# Patient Record
Sex: Male | Born: 1935 | Race: White | Hispanic: No | Marital: Married | Smoking: Former smoker
Health system: Southern US, Community
[De-identification: ages and names within clinical notes are randomized; demographics above are authoritative.]

## PROBLEM LIST (undated history)

## (undated) DIAGNOSIS — K219 Gastro-esophageal reflux disease without esophagitis: Secondary | ICD-10-CM

## (undated) DIAGNOSIS — C159 Malignant neoplasm of esophagus, unspecified: Secondary | ICD-10-CM

## (undated) DIAGNOSIS — I10 Essential (primary) hypertension: Secondary | ICD-10-CM

## (undated) DIAGNOSIS — I4891 Unspecified atrial fibrillation: Secondary | ICD-10-CM

## (undated) DIAGNOSIS — E785 Hyperlipidemia, unspecified: Secondary | ICD-10-CM

## (undated) DIAGNOSIS — I639 Cerebral infarction, unspecified: Secondary | ICD-10-CM

## (undated) DIAGNOSIS — E079 Disorder of thyroid, unspecified: Secondary | ICD-10-CM

## (undated) DIAGNOSIS — IMO0002 Reserved for concepts with insufficient information to code with codable children: Secondary | ICD-10-CM

## (undated) HISTORY — DX: Cerebral infarction, unspecified: I63.9

## (undated) HISTORY — DX: Gastro-esophageal reflux disease without esophagitis: K21.9

## (undated) HISTORY — DX: Reserved for concepts with insufficient information to code with codable children: IMO0002

## (undated) HISTORY — DX: Hyperlipidemia, unspecified: E78.5

## (undated) HISTORY — DX: Essential (primary) hypertension: I10

---

## 1983-02-25 HISTORY — PX: OTHER SURGICAL HISTORY: SHX169

## 1987-02-25 HISTORY — PX: BACK SURGERY: SHX140

## 1987-02-25 HISTORY — PX: CHOLECYSTECTOMY: SHX55

## 1999-09-09 ENCOUNTER — Encounter: Payer: Self-pay | Admitting: Internal Medicine

## 1999-09-09 ENCOUNTER — Ambulatory Visit (HOSPITAL_COMMUNITY): Admission: RE | Admit: 1999-09-09 | Discharge: 1999-09-09 | Payer: Self-pay | Admitting: Internal Medicine

## 2000-09-07 ENCOUNTER — Ambulatory Visit (HOSPITAL_COMMUNITY): Admission: RE | Admit: 2000-09-07 | Discharge: 2000-09-07 | Payer: Self-pay | Admitting: *Deleted

## 2001-01-07 ENCOUNTER — Encounter: Payer: Self-pay | Admitting: Emergency Medicine

## 2001-01-07 ENCOUNTER — Inpatient Hospital Stay (HOSPITAL_COMMUNITY): Admission: EM | Admit: 2001-01-07 | Discharge: 2001-01-10 | Payer: Self-pay | Admitting: Emergency Medicine

## 2001-01-07 ENCOUNTER — Encounter: Payer: Self-pay | Admitting: Internal Medicine

## 2001-01-08 ENCOUNTER — Encounter: Payer: Self-pay | Admitting: Internal Medicine

## 2006-04-20 ENCOUNTER — Ambulatory Visit (HOSPITAL_COMMUNITY): Admission: RE | Admit: 2006-04-20 | Discharge: 2006-04-20 | Payer: Self-pay | Admitting: *Deleted

## 2011-05-06 ENCOUNTER — Ambulatory Visit (INDEPENDENT_AMBULATORY_CARE_PROVIDER_SITE_OTHER): Payer: BLUE CROSS/BLUE SHIELD | Admitting: General Surgery

## 2011-05-06 ENCOUNTER — Encounter (INDEPENDENT_AMBULATORY_CARE_PROVIDER_SITE_OTHER): Payer: Self-pay | Admitting: General Surgery

## 2011-05-06 VITALS — BP 122/74 | HR 70 | Temp 97.8°F | Resp 18 | Ht 72.0 in | Wt 207.4 lb

## 2011-05-06 DIAGNOSIS — L723 Sebaceous cyst: Secondary | ICD-10-CM | POA: Insufficient documentation

## 2011-05-06 MED ORDER — HYDROCODONE-ACETAMINOPHEN 10-325 MG PO TABS
1.0000 | ORAL_TABLET | Freq: Four times a day (QID) | ORAL | Status: AC | PRN
Start: 2011-05-06 — End: 2012-05-05

## 2011-05-06 NOTE — Progress Notes (Signed)
Addended by: Cherylynn Ridges III on: 05/06/2011 09:48 AM   Modules accepted: Orders

## 2011-05-06 NOTE — Progress Notes (Signed)
Patient ID: Luis Mclean, male   DOB: [birth year], 78 y.o.   MRN: [id number]  Chief Complaint  Patient presents with  . Cyst    Neck    HPI Luis Mclean is a 78 y.o. male.  Sebaceous cyst on the back of his neck HPI  Has been there for several months, has not been infected.  No past medical history on file.  No past surgical history on file.  No family history on file.  Social History History  Substance Use Topics  . Smoking status: Former Smoker    Types: Cigars    Quit date: 05/05/1997  . Smokeless tobacco: Never Used  . Alcohol Use: No    No Known Allergies  No current outpatient prescriptions on file.    Review of Systems Review of Systems  Constitutional: Negative.   HENT: Positive for neck pain (posteriorly at sight of 4 cm cyst, not infected.).   Eyes: Negative.   Respiratory: Negative.   Cardiovascular: Negative.   Neurological: Negative.   Hematological: Negative.   Psychiatric/Behavioral: Negative.     Blood pressure 122/74, pulse 70, temperature 97.8 F (36.6 C), temperature source Temporal, resp. rate 18, height 6' (1.829 m), weight 207 lb 6.4 oz (94.076 kg).  Physical Exam Physical Exam  Constitutional: He is oriented to person, place, and time. He appears well-developed and well-nourished.  HENT:  Head: Normocephalic and atraumatic.  Eyes: Conjunctivae are normal. Pupils are equal, round, and reactive to light.  Neck: Normal range of motion. Neck supple. Tracheal tenderness (at mass on neck) present.    Cardiovascular: Normal rate and normal pulses.  An irregularly irregular rhythm present.  Pulmonary/Chest: Effort normal and breath sounds normal.  Abdominal: Soft. Bowel sounds are normal.  Neurological: He is alert and oriented to person, place, and time. He has normal reflexes.  Psychiatric: He has a normal mood and affect. His behavior is normal. Judgment and thought content normal.    Data Reviewed None  I have reviewed  the patients history and he does have medical problems of atrial fibrillation and hypertension.  Has had CVA in the past.  Assessment    Sebaceous cyst on the back of neck    Plan    Hold coumadin at the proper time Plan for surgery in the near future.       Noelle Hoogland III,Dylyn Mclaren O 05/06/2011, 9:11 AM

## 2011-06-09 ENCOUNTER — Other Ambulatory Visit (HOSPITAL_COMMUNITY): Payer: Self-pay

## 2011-06-16 ENCOUNTER — Ambulatory Visit: Admit: 2011-06-16 | Payer: Self-pay | Admitting: General Surgery

## 2011-06-16 SURGERY — IRRIGATION AND DEBRIDEMENT SEBACEOUS CYST
Anesthesia: General

## 2012-10-11 ENCOUNTER — Other Ambulatory Visit: Payer: Self-pay | Admitting: Gastroenterology

## 2012-10-11 DIAGNOSIS — R109 Unspecified abdominal pain: Secondary | ICD-10-CM

## 2012-10-20 ENCOUNTER — Ambulatory Visit
Admission: RE | Admit: 2012-10-20 | Discharge: 2012-10-20 | Disposition: A | Discharge status: Home / Self Care | Payer: Medicare Other | Source: Ambulatory Visit | Attending: Gastroenterology | Admitting: Gastroenterology

## 2012-10-20 DIAGNOSIS — R109 Unspecified abdominal pain: Secondary | ICD-10-CM

## 2012-10-21 ENCOUNTER — Other Ambulatory Visit: Payer: Self-pay | Admitting: Gastroenterology

## 2012-10-21 DIAGNOSIS — R9389 Abnormal findings on diagnostic imaging of other specified body structures: Secondary | ICD-10-CM

## 2012-10-29 ENCOUNTER — Ambulatory Visit
Admission: RE | Admit: 2012-10-29 | Discharge: 2012-10-29 | Disposition: A | Discharge status: Home / Self Care | Payer: Medicare Other | Source: Ambulatory Visit | Attending: Gastroenterology | Admitting: Gastroenterology

## 2012-10-29 DIAGNOSIS — R9389 Abnormal findings on diagnostic imaging of other specified body structures: Secondary | ICD-10-CM

## 2012-10-29 MED ORDER — GADOBENATE DIMEGLUMINE 529 MG/ML IV SOLN
9.0000 mL | Freq: Once | INTRAVENOUS | Status: AC | PRN
  Administered 2012-10-29: 9 mL via INTRAVENOUS

## 2012-11-08 ENCOUNTER — Other Ambulatory Visit: Payer: Self-pay | Admitting: Gastroenterology

## 2012-11-08 DIAGNOSIS — C159 Malignant neoplasm of esophagus, unspecified: Secondary | ICD-10-CM

## 2012-11-08 HISTORY — PX: OTHER SURGICAL HISTORY: SHX169

## 2012-11-08 HISTORY — DX: Malignant neoplasm of esophagus, unspecified: C15.9

## 2012-11-10 ENCOUNTER — Telehealth: Payer: Self-pay | Admitting: Oncology

## 2012-11-10 NOTE — Telephone Encounter (Signed)
REFERRAL FORWARDED TO GINA. D

## 2012-11-18 ENCOUNTER — Telehealth: Payer: Self-pay | Admitting: *Deleted

## 2012-11-18 NOTE — Telephone Encounter (Signed)
Unable to reach patient by phone.  Left voice message to return call.

## 2012-11-22 ENCOUNTER — Telehealth: Payer: Self-pay | Admitting: *Deleted

## 2012-11-22 NOTE — Telephone Encounter (Signed)
Received phone message from patient confirming appointment with Dr. Truett Perna on 11/23/12.

## 2012-11-23 ENCOUNTER — Other Ambulatory Visit: Payer: Self-pay | Admitting: *Deleted

## 2012-11-23 ENCOUNTER — Ambulatory Visit (HOSPITAL_BASED_OUTPATIENT_CLINIC_OR_DEPARTMENT_OTHER): Payer: Medicare Other

## 2012-11-23 ENCOUNTER — Encounter: Payer: Self-pay | Admitting: Oncology

## 2012-11-23 ENCOUNTER — Ambulatory Visit (HOSPITAL_BASED_OUTPATIENT_CLINIC_OR_DEPARTMENT_OTHER): Payer: Medicare Other | Admitting: Oncology

## 2012-11-23 ENCOUNTER — Telehealth: Payer: Self-pay | Admitting: Oncology

## 2012-11-23 DIAGNOSIS — K7689 Other specified diseases of liver: Secondary | ICD-10-CM

## 2012-11-23 DIAGNOSIS — E119 Type 2 diabetes mellitus without complications: Secondary | ICD-10-CM

## 2012-11-23 DIAGNOSIS — C159 Malignant neoplasm of esophagus, unspecified: Secondary | ICD-10-CM | POA: Insufficient documentation

## 2012-11-23 DIAGNOSIS — R1310 Dysphagia, unspecified: Secondary | ICD-10-CM

## 2012-11-23 DIAGNOSIS — C155 Malignant neoplasm of lower third of esophagus: Secondary | ICD-10-CM

## 2012-11-23 DIAGNOSIS — R52 Pain, unspecified: Secondary | ICD-10-CM

## 2012-11-23 DIAGNOSIS — N289 Disorder of kidney and ureter, unspecified: Secondary | ICD-10-CM

## 2012-11-23 NOTE — Telephone Encounter (Signed)
gv adn printed appt sched and avs for pt for OCT...emaield MW to add tx.

## 2012-11-23 NOTE — Progress Notes (Signed)
Checked in new patient with no financial issues. Mail and phone only °

## 2012-11-23 NOTE — Progress Notes (Signed)
[facility] New Patient Consult   Referring MD: Kendall Justo Gravley 79 y.o.  [birth year]    Reason for Referral: Esophagus cancer     HPI: He reports a three-month history of solid dysphagia, subxiphoid pain, and "indigestion ". He saw Dr. Evette Cristal and was referred for imaging studies. An upper GI study on 10/20/2012 revealed abnormal filling defects and a hiatal hernia and adjacent distal esophagus. A 13 mm barium tablet impacted at the GE junction. An abdominal ultrasound on 10/20/2012 revealed an area of questionable increased echogenicity at the posterior right liver concerning for a mass. The remainder of the liver appeared normal.  An MRI of the liver on 10/30/2012 confirmed a mass in segment 7 of the right hepatic lobe measuring 3.3 x 3 cm. The mass enhanced. The mass was felt to be indeterminate and a biopsy was recommended.  He was taken to an upper endoscopy procedure on 11/08/2012. A large mass was found in the lower third of the esophagus beginning at 36 cm and extending to 40 cm. The mass was partially obstructing. Biopsies were obtained. The distal portion of the mass could be seen at the GE junction on retroflexion. The stomach and duodenum appeared normal.  The pathology ([id number]) confirmed a squamous carcinoma with focal glandular differentiation.   Past Medical History  Diagnosis Date  . Diabetes mellitus   . Hyperlipidemia   . Hypertension   . Stroke-right hand tremor persists   15 years ago   . GERD (gastroesophageal reflux disease)   . Cyst     back of neck   .    Atrial fibrillation  .   Right foot drop following back surgery  .   Renal insufficiency  .   Right foot neuropathy  Past Surgical History  Procedure Laterality Date  . Cholecystectomy  1989  . Rectal fissure repair  1985  . Back surgery  1989   .     Family History  Problem Relation Age of Onset  . Cancer Brother     colon  . Cancer Brother     bone  .  Cancer Brother     kidney   5 siblings, maternal and paternal cousins had "cancer "-he cannot be specific  Current outpatient prescriptions:BENAZEPRIL HCL PO, Take 25 mg by mouth daily. Takes 1/2 daily, Disp: , Rfl: ;  gabapentin (NEURONTIN) 300 MG capsule, Take 300 mg by mouth every evening., Disp: , Rfl: ;  HYDROCHLOROTHIAZIDE PO, Take 40 mg by mouth daily. Patient takes 1/2 tablet daily per dosage., Disp: , Rfl: ;  insulin glargine (LANTUS) 100 UNIT/ML injection, Inject 50 Units into the skin every morning., Disp: , Rfl:  levothyroxine (SYNTHROID, LEVOTHROID) 100 MCG tablet, Take 100 mcg by mouth daily., Disp: , Rfl: ;  omeprazole (PRILOSEC) 20 MG capsule, Take 20 mg by mouth daily., Disp: , Rfl: ;  simvastatin (ZOCOR) 40 MG tablet, Take 40 mg by mouth every evening., Disp: , Rfl: ;  warfarin (COUMADIN) 7.5 MG tablet, Take 7.5 mg by mouth daily., Disp: , Rfl:   Allergies: No Known Allergies  Social History: He lives in [place]. He is retired from working as a Counselling psychologist. He smoked a pipe and quit 16 years ago. 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 No transfusion history. No risk factor for HIV or hepatitis. He was in the [employer]    ROS:   Positives include: Anorexia, 20 pound  weight loss, pain at the xiphoid, right footdrop following back surgery, right hand tremor, pains/tingling in the right toes  A complete ROS was otherwise negative.  Physical Exam:  There were no vitals taken for this visit.  HEENT: Oropharynx without visible mass, neck without mass Lungs: Clear bilaterally Cardiac: Regular rate and rhythm Abdomen: No hepatosplenomegaly, nontender, no GU: Testes without mass  Vascular: No leg edema Lymph nodes: No cervical, supraclavicular, axillary, or inguinal nodes Neurologic: Alert and oriented, the motor exam appears intact in the upper and lower extremities. Right greater than left hand tremor that appears worse with intention Skin: No  rash Musculoskeletal: No spine tenderness   LAB: CBC, chemistry panel, and CEA will be obtained on 11/25/2012  Radiology: As per history of present illness, PET scan ordered    Assessment/Plan:   1. 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 Indeterminate right hepatic mass on ultrasound-10/20/2012/MRI-10/29/2012  3. Dysphagia/subxiphoid pain secondary to #1  4. History of atrial fibrillation-maintained on Coumadin  5. Renal insufficiency  6. History of a CVA  7. 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 Diabetes   Disposition:   Mr. Monsanto has been diagnosed with esophagus cancer. I discussed the diagnosis and treatment options with Mr. Armstrong and his wife. There is no clinical evidence of distant metastatic disease, though there is an indeterminate right hepatic mass on an MRI 10/29/2012.  He is symptomatic with dysphagia and pain. I recommend concurrent chemotherapy and radiation. He will be referred for a staging PET scan. If there is no evidence of distant metastatic disease the plan will be to proceed with Taxol/carboplatin and radiation.  We reviewed the potential toxicities associated with the Taxol/carboplatin regimen including the chance for nausea/vomiting, mucositis, alopecia, diarrhea, and hematologic toxicity. We discussed the allergic reaction, neuropathy, bone pain, and ototoxicity associated with Taxol. We discussed the risk of an allergic reaction with carboplatin.  He will be premedicated with Decadron prior to the first cycle of chemotherapy. Mr. Tomerlin will be scheduled for baseline laboratory studies and a chemotherapy teaching class on 11/25/2012. He will be referred to Dr. Mitzi Hansen.  The plan is to begin concurrent therapy on 12/06/2012. I will see him for an office visit that day.  We discussed the indication for an esophagectomy if he is found to have localized disease. He has  multiple comorbid conditions and I doubt he will be a candidate for an esophagectomy. I will present his case at the GI tumor conference within the next few weeks.  Approximately 50 minutes were spent with the patient and his wife today. The majority of the time was used for counseling and coordination of care.  Ronny Korff 11/23/2012, 5:41 PM

## 2012-11-23 NOTE — Progress Notes (Signed)
Met with Luis Mclean and wife. Explained role of nurse navigator. Educational information provided on esophageal cancer  Referral made to dietician for diet education. [facility] resources provided to patient, including SW service information.  Contact names and phone numbers were provided for entire [facility] team.  No barriers to care identified.  Will continue to follow as needed.

## 2012-11-24 ENCOUNTER — Telehealth: Payer: Self-pay | Admitting: *Deleted

## 2012-11-24 ENCOUNTER — Encounter: Payer: Self-pay | Admitting: Radiation Oncology

## 2012-11-24 ENCOUNTER — Other Ambulatory Visit: Payer: Self-pay | Admitting: *Deleted

## 2012-11-24 DIAGNOSIS — C159 Malignant neoplasm of esophagus, unspecified: Secondary | ICD-10-CM

## 2012-11-24 MED ORDER — DEXAMETHASONE 2 MG PO TABS: 10.0000 mg | ORAL_TABLET | ORAL | Status: DC

## 2012-11-24 MED ORDER — PROCHLORPERAZINE MALEATE 5 MG PO TABS: 5.0000 mg | ORAL_TABLET | Freq: Four times a day (QID) | ORAL | Status: DC | PRN

## 2012-11-24 MED ORDER — LIDOCAINE-PRILOCAINE 2.5-2.5 % EX CREA: TOPICAL_CREAM | CUTANEOUS | Status: DC | PRN

## 2012-11-24 NOTE — Progress Notes (Signed)
Spoke with supervisor in nuclear med regarding getting PET scan done prior to his 10/13 chemo at request of Dr. Truett Perna. Will have precert done asap by managed care department so he can be worked in when a cancellation occurs, which is common. Fairly sure he will be able to be worked in prior to 10/13. Left message with Lilyan Punt in managed care to start working on precert.

## 2012-11-24 NOTE — Progress Notes (Signed)
GI Location of Tumor / Histology: Lower third esophagus   Patient presented 3 month history of solid dysphagia,subxiphoid pain and indigestion  Biopsies of : 11/08/12:Esophagus, biopsy, mass- CARCINOMA WITH SQUAMOUS AND FOCAL GLANDULAR DIFFERENTIATION.  Past/Anticipated interventions by surgeon, if any:   Past/Anticipated interventions by medical oncology, if any: referred for staging Pet scan to be done before 12/06/12;  Will determine whether to start Taxol/carboplatin , Chemotherapy education 11/25/12 at 4:30pm ,appt 12/06/12  Weight changes, if any: 20 lb wt.loss  Bowel/Bladder complaints, if any: none Nausea / Vomiting, if any: yes ,  Reflux ,got dizzy and threw up  11/24/12 dinner time   Pain issues, if any: xiphoid,  SAFETY ISSUES:foot drop right, unsteady,hx stroke ,dizzyness yesterday, hx vertigo,   Prior radiation? no  Pacemaker/ICD? no  Is the patient on methotrexate? no  Current Complaints / other details: Married,  3 sons, Retired Patent attorney was in [employer]; ,brother colon cancer, 2nd brother bone cancer,3rd brother kidney cancer, maternal and paternal cousins unkown types cancer /hx Etoh abuse quit 30 years ago, smoked pipe quit 16 years ago,right hepatic mass U/s 10/20/12 and MRI 10/29/12 right foot drop& Neuropathy , s/p back surgery, Hx A-fib/coumadin maintained, hx stroke,15 years ago,r hand tremors still

## 2012-11-24 NOTE — Telephone Encounter (Signed)
Per staff message and POF I have scheduled appts.  JMW  

## 2012-11-25 ENCOUNTER — Telehealth: Payer: Self-pay | Admitting: *Deleted

## 2012-11-25 ENCOUNTER — Ambulatory Visit
Admission: RE | Admit: 2012-11-25 | Discharge: 2012-11-25 | Disposition: A | Discharge status: Home / Self Care | Payer: Medicare Other | Source: Ambulatory Visit | Attending: Radiation Oncology | Admitting: Radiation Oncology

## 2012-11-25 ENCOUNTER — Encounter: Payer: Self-pay | Admitting: Radiation Oncology

## 2012-11-25 ENCOUNTER — Other Ambulatory Visit: Payer: Medicare Other

## 2012-11-25 VITALS — BP 161/90 | HR 101 | Temp 97.9°F | Resp 20 | Ht 72.0 in | Wt 191.0 lb

## 2012-11-25 DIAGNOSIS — E079 Disorder of thyroid, unspecified: Secondary | ICD-10-CM | POA: Insufficient documentation

## 2012-11-25 DIAGNOSIS — Z794 Long term (current) use of insulin: Secondary | ICD-10-CM | POA: Insufficient documentation

## 2012-11-25 DIAGNOSIS — I10 Essential (primary) hypertension: Secondary | ICD-10-CM | POA: Insufficient documentation

## 2012-11-25 DIAGNOSIS — Z8673 Personal history of transient ischemic attack (TIA), and cerebral infarction without residual deficits: Secondary | ICD-10-CM | POA: Insufficient documentation

## 2012-11-25 DIAGNOSIS — Z7901 Long term (current) use of anticoagulants: Secondary | ICD-10-CM | POA: Insufficient documentation

## 2012-11-25 DIAGNOSIS — E785 Hyperlipidemia, unspecified: Secondary | ICD-10-CM | POA: Insufficient documentation

## 2012-11-25 DIAGNOSIS — C159 Malignant neoplasm of esophagus, unspecified: Secondary | ICD-10-CM

## 2012-11-25 DIAGNOSIS — C155 Malignant neoplasm of lower third of esophagus: Secondary | ICD-10-CM | POA: Insufficient documentation

## 2012-11-25 DIAGNOSIS — K769 Liver disease, unspecified: Secondary | ICD-10-CM | POA: Insufficient documentation

## 2012-11-25 DIAGNOSIS — Z79899 Other long term (current) drug therapy: Secondary | ICD-10-CM | POA: Insufficient documentation

## 2012-11-25 DIAGNOSIS — K219 Gastro-esophageal reflux disease without esophagitis: Secondary | ICD-10-CM | POA: Insufficient documentation

## 2012-11-25 DIAGNOSIS — E119 Type 2 diabetes mellitus without complications: Secondary | ICD-10-CM | POA: Insufficient documentation

## 2012-11-25 HISTORY — DX: Unspecified atrial fibrillation: I48.91

## 2012-11-25 HISTORY — DX: Disorder of thyroid, unspecified: E07.9

## 2012-11-25 HISTORY — DX: Malignant neoplasm of esophagus, unspecified: C15.9

## 2012-11-25 NOTE — Telephone Encounter (Signed)
Called Aletta Casacchia,RN and Hinton Rao left voice message, patient cancelling todays chemotherapy education, not feeling well, and wants to reschedule at later date, can leave voice message on home phone, here for consult today 8:56 AM

## 2012-11-25 NOTE — Telephone Encounter (Signed)
Received message from patient requesting to change chemo class to 10/3 or a day next week.  Will give message to Dr. Kalman Drape nurse.

## 2012-11-25 NOTE — Progress Notes (Signed)
Please see the Nurse Progress Note in the MD Initial Consult Encounter for this patient. 

## 2012-11-26 ENCOUNTER — Ambulatory Visit
Admission: RE | Admit: 2012-11-26 | Discharge: 2012-11-26 | Disposition: A | Discharge status: Home / Self Care | Payer: Medicare Other | Source: Ambulatory Visit | Attending: Radiation Oncology | Admitting: Radiation Oncology

## 2012-11-26 DIAGNOSIS — C155 Malignant neoplasm of lower third of esophagus: Secondary | ICD-10-CM

## 2012-11-26 DIAGNOSIS — Z510 Encounter for antineoplastic radiation therapy: Secondary | ICD-10-CM | POA: Insufficient documentation

## 2012-11-26 DIAGNOSIS — R5381 Other malaise: Secondary | ICD-10-CM | POA: Insufficient documentation

## 2012-11-26 DIAGNOSIS — E860 Dehydration: Secondary | ICD-10-CM | POA: Insufficient documentation

## 2012-11-26 DIAGNOSIS — Z79899 Other long term (current) drug therapy: Secondary | ICD-10-CM | POA: Insufficient documentation

## 2012-11-26 DIAGNOSIS — C159 Malignant neoplasm of esophagus, unspecified: Secondary | ICD-10-CM | POA: Insufficient documentation

## 2012-11-26 DIAGNOSIS — K209 Esophagitis, unspecified without bleeding: Secondary | ICD-10-CM | POA: Insufficient documentation

## 2012-11-26 DIAGNOSIS — C787 Secondary malignant neoplasm of liver and intrahepatic bile duct: Secondary | ICD-10-CM | POA: Insufficient documentation

## 2012-11-26 NOTE — Progress Notes (Signed)
Radiation Oncology         [phone] ________________________________  Name: Luis Mclean MRN: [id number]  Date: 11/25/2012  DOB: Aug 19, 1929  WU:[id number],JYNWGN DAVIDSON, MD  Ladene Artist, MD     REFERRING PHYSICIAN: Ladene Artist, MD   DIAGNOSIS: The encounter diagnosis was Esophagus cancer.   HISTORY OF PRESENT ILLNESS::Luis Mclean is a 79 y.o. male who is seen for an initial consultation visit. The patient has a history of dysphasia as well as some subxiphoid pain. He states that this has been present for several months. He also has an approximate 20 pound weight loss. The patient proceeded with an upper GI study on 10/20/2012. This was abnormal with a filling defect with barium impacted at the GE junction. He proceeded to undergo an abdominal ultrasound also on this date which showed a posterior right liver area concerning for a possible mass.  The patient proceeded to undergo an MRI scan of the abdomen on 10/29/2012. The right hepatic lobe mass had nonspecific imaging characteristics and malignancy could not be excluded.  The patient proceeded to undergo an upper endoscopy on 11/08/2012. A large mass was present within the lower third of the esophagus beginning at 36 cm and extending for 4 cm more distally. This was partially obstructing and biopsies were obtained. The mass was seen extending to the GE junction. Pathology returned positive for squamous cell carcinoma.   The patient has been seen by Dr. Truett Perna in medical oncology. A PET scan is pending. No biopsy currently has been scheduled for the liver mass pending the PET scan.   PREVIOUS RADIATION THERAPY: No   PAST MEDICAL HISTORY:  has a past medical history of Hyperlipidemia; Hypertension; Stroke; GERD (gastroesophageal reflux disease); Cyst; Esophageal cancer (11/08/12); Diabetes mellitus; A-fib; and Thyroid disease.     PAST SURGICAL HISTORY: Past Surgical History  Procedure Laterality Date  .  Cholecystectomy  1989  . Rectal fissure repair  1985  . Back surgery  1989  . Esophageal biopsy  11/08/12    squamous cell & focal glandular diff     FAMILY HISTORY: family history includes Cancer in his brother, brother, and brother.   SOCIAL HISTORY:  reports that he quit smoking about 15 years ago. His smoking use included Cigars. He has never used smokeless tobacco. He reports that he does not drink alcohol or use illicit drugs.   ALLERGIES: Review of patient's allergies indicates no known allergies.   MEDICATIONS:  Current Outpatient Prescriptions  Medication Sig Dispense Refill  . BENAZEPRIL HCL PO Take 25 mg by mouth daily. Takes 1/2 daily      . diphenhydrAMINE (SOMINEX) 25 MG tablet Take 25 mg by mouth at bedtime as needed for sleep.      Marland Kitchen gabapentin (NEURONTIN) 300 MG capsule Take 300 mg by mouth every evening.      Marland Kitchen HYDROCHLOROTHIAZIDE PO Take 40 mg by mouth daily. Patient takes 1/2 tablet daily per dosage.      . insulin glargine (LANTUS) 100 UNIT/ML injection Inject 50 Units into the skin every morning.      Marland Kitchen levothyroxine (SYNTHROID, LEVOTHROID) 100 MCG tablet Take 100 mcg by mouth daily.      Marland Kitchen omeprazole (PRILOSEC) 20 MG capsule Take 20 mg by mouth daily.      . simvastatin (ZOCOR) 40 MG tablet Take 40 mg by mouth every evening.      . warfarin (COUMADIN) 7.5 MG tablet Take 7.5 mg by mouth daily.      Marland Kitchen  dexamethasone (DECADRON) 2 MG tablet Take 5 tablets (10 mg total) by mouth as directed. Take #5 tabs (10 mg) at 10 pm night before 1st chemo AND at 6 am day of 1st chemo.  10 tablet  0  . lidocaine-prilocaine (EMLA) cream Apply topically as needed. Apply 1 teaspoon to PAC site 1-2 hours prior to stick and cover with plastic wrap  30 g  prn  . prochlorperazine (COMPAZINE) 5 MG tablet Take 1 tablet (5 mg total) by mouth every 6 (six) hours as needed for nausea.  60 tablet  1   No current facility-administered medications for this encounter.     REVIEW OF SYSTEMS:   A 15 point review of systems is documented in the electronic medical record. This was obtained by the nursing staff. However, I reviewed this with the patient to discuss relevant findings and make appropriate changes.  Pertinent items are noted in HPI.    PHYSICAL EXAM:  height is 6' (1.829 m) and weight is 191 lb (86.637 kg). His oral temperature is 97.9 F (36.6 C). His blood pressure is 161/90 and his pulse is 101. His respiration is 20.   General: Well-developed, in no acute distress HEENT: Normocephalic, atraumatic Cardiovascular: Regular rate and rhythm Respiratory: Clear to auscultation bilaterally GI: Soft, nontender, normal bowel sounds Extremities: No edema present    LABORATORY DATA:  No results found for this basename: WBC, HGB, HCT, MCV, PLT   No results found for this basename: NA, K, CL, CO2   No results found for this basename: ALT, AST, GGT, ALKPHOS, BILITOT      RADIOGRAPHY: Mr Abdomen W Wo Contrast  10/30/2012   CLINICAL DATA:  Abdominal pain and indigestion. Possible mass in the right hepatic lobe.  EXAM: MRI ABDOMEN WITH AND WITHOUT CONTRAST  TECHNIQUE: Multiplanar multisequence MR imaging of the abdomen was performed both before and after administration of intravenous contrast.  CONTRAST:  9mL MULTIHANCE GADOBENATE DIMEGLUMINE 529 MG/ML IV SOLN  COMPARISON:  10/20/2012  FINDINGS: Hiatal hernia noted with irregular wall thickening as shown on prior upper GI examination.  No biliary dilatation or dorsal pancreatic duct dilatation. Colonic diverticulosis is observed with chronic appearing bilateral para renal stranding. Adrenal glands normal  A mass in segment 7 of the right hepatic lobe measures 3.3 x 3.0 cm. This appears to have very subtle signal dropout on out of phase images and mildly increased T2 signal compared to hepatic parenchyma.  After contrast administration, the mass remains low attenuation with respect to the hepatic parenchyma but does appear to enhance,  increasing from 165 units 2 274 units by the 92nd images on a representative internal section. On delayed images the enhancement is somewhat heterogeneous.  Several right-sided Bosniak category 2 cysts are present, with the larger measuring 3.0 by 2.4 cm on image 60 of series 11. Mildly enlarged gastrohepatic ligament lymph node at 1.1 cm.  IMPRESSION: 1. The segment 7 right hepatic lobe mass has nonspecific imaging characteristics. Its presence was not noted on the prior CT scan from 09/09/1999. Malignancy is not excluded and tissue diagnosis is recommended. 2. Hiatal hernia with wall thickening as shown on prior upper GI examination. This appeared irregular and worrisome for the possibility of malignancy on upper GI exam, and if not recently performed, upper endoscopy remains recommended. 3. Colonic diverticulosis. 4. Complex but benign appearing right renal cysts. 5. Mild enlarged gastrohepatic ligament lymph node.   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 His staging is still pending with a PET scan having been ordered as soon as possible. Depending on this result, additional workup may be appropriate including a biopsy of a possible suspicious area within the liver. Endoscopic ultrasound has not been completed and this also may depend on the PET scan. This also may not significantly changed his management for multiple reasons including the fact that the patient does not appear to be a very good candidate for surgery given his age and comorbidities. In the absence of planned surgical resection I would recommend definitive chemoradiotherapy even for a low T. stage in the absence of nodal disease.  I therefore discussed with the patient a potential role for radiotherapy in this setting. We discussed a 5-1/2-6 week course of treatment with daily radiation. I discussed the  rationale of this treatment in addition to the possible side effects and risks of treatment. All of his questions were answered. The patient is interested in proceeding with further workup as noted above and is anxious to get started with treatment as soon as feasible.    PLAN: A tentative start date has been set at 12/06/2012 this will depend to some degree on when the patient's PET scan can be completed. The patient will either be treated with a IMRT technique from the beginning of his course of radiation, or he may begin with a boost treatment initially using conventional technique prior to IMRT.     I spent 60 minutes face to face with the patient and more than 50% of that time was spent in counseling and/or coordination of care.    ________________________________   Radene Gunning, MD, PhD

## 2012-11-29 ENCOUNTER — Telehealth: Payer: Self-pay | Admitting: *Deleted

## 2012-11-29 ENCOUNTER — Other Ambulatory Visit: Payer: Self-pay | Admitting: *Deleted

## 2012-11-29 NOTE — Telephone Encounter (Signed)
Spoke with patient's wife by phone and gave appointment for PET scan on 12/02/12.  Instructions, per Radiology, given.  POF sent to scheduling to re-schedule chemo class for this week and notify patient.  Patient's wife aware of above and was without questions.

## 2012-11-30 ENCOUNTER — Telehealth: Payer: Self-pay | Admitting: Oncology

## 2012-11-30 NOTE — Telephone Encounter (Signed)
Talked to ptr's wife she is aware of chemo class date and time

## 2012-12-01 ENCOUNTER — Telehealth: Payer: Self-pay | Admitting: Oncology

## 2012-12-01 ENCOUNTER — Other Ambulatory Visit: Payer: Self-pay | Admitting: *Deleted

## 2012-12-01 NOTE — Telephone Encounter (Signed)
Gave pt appt for lab and chemo fotr 10/20, pt will get new appt calendar tomorrow

## 2012-12-02 ENCOUNTER — Encounter: Payer: Self-pay | Admitting: *Deleted

## 2012-12-02 ENCOUNTER — Encounter (HOSPITAL_COMMUNITY)
Admission: RE | Admit: 2012-12-02 | Discharge: 2012-12-02 | Disposition: A | Discharge status: Home / Self Care | Payer: Medicare Other | Source: Ambulatory Visit | Attending: Oncology | Admitting: Oncology

## 2012-12-02 ENCOUNTER — Encounter (HOSPITAL_COMMUNITY): Payer: Self-pay

## 2012-12-02 ENCOUNTER — Other Ambulatory Visit: Payer: Medicare Other

## 2012-12-02 DIAGNOSIS — C159 Malignant neoplasm of esophagus, unspecified: Secondary | ICD-10-CM

## 2012-12-02 LAB — GLUCOSE, CAPILLARY: Glucose-Capillary: 214 mg/dL — ABNORMAL HIGH (ref 70–99)

## 2012-12-02 MED ORDER — FLUDEOXYGLUCOSE F - 18 (FDG) INJECTION
20.3000 | Freq: Once | INTRAVENOUS | Status: AC | PRN
  Administered 2012-12-02: 20.3 via INTRAVENOUS

## 2012-12-03 ENCOUNTER — Telehealth: Payer: Self-pay | Admitting: *Deleted

## 2012-12-03 NOTE — Telephone Encounter (Signed)
after speaking with Archer Asa,, she wasn't sure patient has been told any of this, and that Dr.Sherrill is off also today, they will proceed with seeing patient on Monday", called and informed Dr.moody, he said Okay and that he would just proceed then on Monday and port and treat patient as scheduled,can talk with Dr.Sherrill when he gets back" 11:45 AM

## 2012-12-03 NOTE — Telephone Encounter (Signed)
Call from pharmacy to make Dr. Truett Perna aware pt has not had creatinine checked for chemo calculation. Called pt, instructed him to arrive at 0945 for lab prior to office on 10/13. He voiced understanding. Noted orders in computer for CBC/CMET and CEA. Request to schedulers for Lab appt.

## 2012-12-06 ENCOUNTER — Other Ambulatory Visit: Payer: Self-pay | Admitting: *Deleted

## 2012-12-06 ENCOUNTER — Telehealth: Payer: Self-pay | Admitting: Oncology

## 2012-12-06 ENCOUNTER — Ambulatory Visit: Admission: RE | Admit: 2012-12-06 | Payer: Medicare Other | Source: Ambulatory Visit | Admitting: Radiation Oncology

## 2012-12-06 ENCOUNTER — Ambulatory Visit
Admission: RE | Admit: 2012-12-06 | Discharge: 2012-12-06 | Disposition: A | Discharge status: Home / Self Care | Payer: Medicare Other | Source: Ambulatory Visit | Attending: Radiation Oncology | Admitting: Radiation Oncology

## 2012-12-06 ENCOUNTER — Ambulatory Visit: Payer: Medicare Other | Admitting: Nutrition

## 2012-12-06 ENCOUNTER — Ambulatory Visit (HOSPITAL_BASED_OUTPATIENT_CLINIC_OR_DEPARTMENT_OTHER): Payer: Medicare Other

## 2012-12-06 ENCOUNTER — Ambulatory Visit: Payer: Medicare Other | Admitting: Lab

## 2012-12-06 ENCOUNTER — Ambulatory Visit (HOSPITAL_BASED_OUTPATIENT_CLINIC_OR_DEPARTMENT_OTHER): Payer: Medicare Other | Admitting: Oncology

## 2012-12-06 ENCOUNTER — Telehealth: Payer: Self-pay | Admitting: *Deleted

## 2012-12-06 ENCOUNTER — Other Ambulatory Visit (HOSPITAL_BASED_OUTPATIENT_CLINIC_OR_DEPARTMENT_OTHER): Payer: Medicare Other | Admitting: Lab

## 2012-12-06 VITALS — BP 154/73 | HR 111 | Temp 97.7°F | Resp 20 | Ht 72.0 in | Wt 191.0 lb

## 2012-12-06 VITALS — BP 135/86 | HR 70 | Temp 96.6°F | Resp 18

## 2012-12-06 DIAGNOSIS — C155 Malignant neoplasm of lower third of esophagus: Secondary | ICD-10-CM

## 2012-12-06 DIAGNOSIS — K7689 Other specified diseases of liver: Secondary | ICD-10-CM

## 2012-12-06 DIAGNOSIS — E119 Type 2 diabetes mellitus without complications: Secondary | ICD-10-CM

## 2012-12-06 DIAGNOSIS — Z5111 Encounter for antineoplastic chemotherapy: Secondary | ICD-10-CM

## 2012-12-06 DIAGNOSIS — C159 Malignant neoplasm of esophagus, unspecified: Secondary | ICD-10-CM

## 2012-12-06 DIAGNOSIS — R079 Chest pain, unspecified: Secondary | ICD-10-CM

## 2012-12-06 DIAGNOSIS — Z23 Encounter for immunization: Secondary | ICD-10-CM

## 2012-12-06 DIAGNOSIS — N289 Disorder of kidney and ureter, unspecified: Secondary | ICD-10-CM

## 2012-12-06 DIAGNOSIS — R1310 Dysphagia, unspecified: Secondary | ICD-10-CM

## 2012-12-06 LAB — COMPREHENSIVE METABOLIC PANEL (CC13)
ALT: 7 U/L (ref 0–55)
AST: 11 U/L (ref 5–34)
Alkaline Phosphatase: 89 U/L (ref 40–150)
Potassium: 5 mEq/L (ref 3.5–5.1)
Sodium: 132 mEq/L — ABNORMAL LOW (ref 136–145)
Total Bilirubin: 0.3 mg/dL (ref 0.20–1.20)
Total Protein: 7.2 g/dL (ref 6.4–8.3)

## 2012-12-06 LAB — CBC WITH DIFFERENTIAL/PLATELET
EOS%: 0 % (ref 0.0–7.0)
MCH: 29 pg (ref 27.2–33.4)
MCV: 87.5 fL (ref 79.3–98.0)
MONO%: 0.7 % (ref 0.0–14.0)
NEUT#: 8.3 10*3/uL — ABNORMAL HIGH (ref 1.5–6.5)
RBC: 4.73 10*6/uL (ref 4.20–5.82)
RDW: 14.1 % (ref 11.0–14.6)
lymph#: 1 10*3/uL (ref 0.9–3.3)
nRBC: 0 % (ref 0–0)

## 2012-12-06 MED ORDER — DIPHENHYDRAMINE HCL 50 MG/ML IJ SOLN
25.0000 mg | Freq: Once | INTRAMUSCULAR | Status: AC
  Administered 2012-12-06: 25 mg via INTRAVENOUS

## 2012-12-06 MED ORDER — INSULIN REGULAR HUMAN 100 UNIT/ML IJ SOLN
10.0000 [IU] | Freq: Once | INTRAMUSCULAR | Status: AC
  Administered 2012-12-06: 15:00:00 via SUBCUTANEOUS
  Filled 2012-12-06: qty 0.1

## 2012-12-06 MED ORDER — SODIUM CHLORIDE 0.9 % IV SOLN
130.0000 mg | Freq: Once | INTRAVENOUS | Status: AC
  Administered 2012-12-06: 130 mg via INTRAVENOUS
  Filled 2012-12-06: qty 13

## 2012-12-06 MED ORDER — FAMOTIDINE IN NACL 20-0.9 MG/50ML-% IV SOLN
20.0000 mg | Freq: Once | INTRAVENOUS | Status: AC
  Administered 2012-12-06: 20 mg via INTRAVENOUS

## 2012-12-06 MED ORDER — FAMOTIDINE IN NACL 20-0.9 MG/50ML-% IV SOLN
INTRAVENOUS | Status: AC
  Filled 2012-12-06: qty 50

## 2012-12-06 MED ORDER — ONDANSETRON 16 MG/50ML IVPB (CHCC)
16.0000 mg | Freq: Once | INTRAVENOUS | Status: AC
  Administered 2012-12-06: 16 mg via INTRAVENOUS

## 2012-12-06 MED ORDER — DIPHENHYDRAMINE HCL 50 MG/ML IJ SOLN
INTRAMUSCULAR | Status: AC
  Filled 2012-12-06: qty 1

## 2012-12-06 MED ORDER — ONDANSETRON 16 MG/50ML IVPB (CHCC)
INTRAVENOUS | Status: AC
  Filled 2012-12-06: qty 16

## 2012-12-06 MED ORDER — SODIUM CHLORIDE 0.9 % IV SOLN
Freq: Once | INTRAVENOUS | Status: AC
  Administered 2012-12-06: 12:00:00 via INTRAVENOUS

## 2012-12-06 MED ORDER — INFLUENZA VAC SPLIT QUAD 0.5 ML IM SUSP
0.5000 mL | Freq: Once | INTRAMUSCULAR | Status: AC
  Administered 2012-12-06: 0.5 mL via INTRAMUSCULAR
  Filled 2012-12-06: qty 0.5

## 2012-12-06 MED ORDER — DEXAMETHASONE SODIUM PHOSPHATE 10 MG/ML IJ SOLN
10.0000 mg | Freq: Once | INTRAMUSCULAR | Status: AC
  Administered 2012-12-06: 10 mg via INTRAVENOUS

## 2012-12-06 MED ORDER — DEXAMETHASONE SODIUM PHOSPHATE 10 MG/ML IJ SOLN
INTRAMUSCULAR | Status: AC
  Filled 2012-12-06: qty 1

## 2012-12-06 MED ORDER — PACLITAXEL CHEMO INJECTION 300 MG/50ML
50.0000 mg/m2 | Freq: Once | INTRAVENOUS | Status: AC
  Administered 2012-12-06: 108 mg via INTRAVENOUS
  Filled 2012-12-06: qty 18

## 2012-12-06 NOTE — Patient Instructions (Signed)
[facility] Discharge Instructions for Patients Receiving Chemotherapy  Today you received the following chemotherapy agents Taxol/Carboplatin To help prevent nausea and vomiting after your treatment, we encourage you to take your nausea medication as prescribed.  If you develop nausea and vomiting that is not controlled by your nausea medication, call the clinic.   BELOW ARE SYMPTOMS THAT SHOULD BE REPORTED IMMEDIATELY:  *FEVER GREATER THAN 100.5 F  *CHILLS WITH OR WITHOUT FEVER  NAUSEA AND VOMITING THAT IS NOT CONTROLLED WITH YOUR NAUSEA MEDICATION  *UNUSUAL SHORTNESS OF BREATH  *UNUSUAL BRUISING OR BLEEDING  TENDERNESS IN MOUTH AND THROAT WITH OR WITHOUT PRESENCE OF ULCERS  *URINARY PROBLEMS  *BOWEL PROBLEMS  UNUSUAL RASH Items with * indicate a potential emergency and should be followed up as soon as possible.  Feel free to call the clinic you have any questions or concerns. The clinic phone number is [phone].    

## 2012-12-06 NOTE — Telephone Encounter (Signed)
Spoke with Gina,RN in med/onc, asked if patient could be treated earlier today in radiation therapy, he is having a 4 hour chemotherapy infusion today, told Almira Coaster I would ask there therapist on [place] #2,but doubted it could be any earlier, will call her back and let her know, went and spoke with Hether,therapist,"no, we have 47 patients scheduled on this machine today,"asked if he could be done 1 hour earlier as his advanced age 79 years, "No, I'm sorry," so I called Primary school teacher back and apologized that we weren't able to get an earlier tie for this patient,she thanked me and will inform Mr. Cypert 10:56 AM

## 2012-12-06 NOTE — Progress Notes (Signed)
Called office of Dr. Merri Brunette and gave receptionist message regarding blood sugar readings and need for PCP to begin SSI coverage for this due to decadron with his chemotherapy weekly. She will forward this message to the nurse and have her call back.

## 2012-12-06 NOTE — Progress Notes (Signed)
Nutrition consult completed with patient and wife in chemotherapy.  Patient is an 79 year old male diagnosed with esophageal cancer with hepatic mass.  He is a patient of Dr. Truett Perna and Dr. Mitzi Hansen.  Past medical history includes diabetes, hyperlipidemia, hypertension, GERD, atrial fibrillation, remote alcohol/tobacco usage.  Medications include Decadron, Lantus, Synthroid, Prilosec, Zocor, Compazine, and Coumadin.  Labs include potassium 3.2, glucose 448, creatinine 1.9, and albumin 3.0 on October 13.  Height: 6 feet 0 inch. Weight: 191 pounds. Usual body weight: 225 pounds per patient.   BMI: 25.9.  Patient reports dysphasia at the point of esophageal mass.  He is tolerating a regular diet.  Patient ate a hotdog yesterday and is consuming a sandwich today. He drinks ensure or boost, one every other day.  Patient reports it takes him much longer to eat now and so he does not eat as much food as he used to eat.  Glucose level likely increased from Decadron.  Nutrition diagnosis: Inadequate oral intake related to diagnosis of esophageal cancer and associated dysphasia as evidenced by a 15% weight loss from usual body weight.  Intervention: Patient was educated to consume small, frequent, high-calorie, high-protein foods throughout the day.  I've encouraged intake of oral nutrition supplements to boost calorie intake.  I have reviewed high protein foods with patient.  I've educated him on strategies for altering textures and temperatures as needed.  Fact sheets and recipes for provided along with coupons.  Recommend diabetic medication adjustment per physician.  Questions were answered.  Teach back method used.  Monitoring, evaluation, and goals: Patient will tolerate increased calories and protein to promote weight maintenance and adequate glycemic control.   Next visit: Monday, October 27, during chemotherapy.

## 2012-12-06 NOTE — Progress Notes (Signed)
   Luis Mclean    OFFICE PROGRESS NOTE   INTERVAL HISTORY:   He returns for scheduled followup of esophagus cancer. He continues to have dysphagia, but he was able to a hot dog yesterday. No new complaint.  Objective:  Vital signs in last 24 hours:  Blood pressure 154/73, pulse 111, temperature 97.7 F (36.5 C), temperature source Oral, resp. rate 20, height 6' (1.829 m), weight 191 lb (86.637 kg).   Resp: Lungs clear bilaterally Cardio: Irregular GI: No hepatomegaly nontender Vascular: No leg edema   Lab Results:  Lab Results  Component Value Date   WBC 9.4 12/06/2012   HGB 13.7 12/06/2012   HCT 41.4 12/06/2012   MCV 87.5 12/06/2012   PLT 266 12/06/2012   ANC 8.3  X-rays: PET scan 12/02/2012-hypermetabolic distal esophagus mass with adjacent hypermetabolic adenopathy at the gastrohepatic ligament. The segment 7 liver mass is hypermetabolic  I reviewed the PET scan and MRI images with Luis Mclean and his wife.    Medications: I have reviewed the patient's current medications.  Assessment/Plan: 1. 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 Indeterminate right hepatic mass on ultrasound-10/20/2012/MRI-10/29/2012 , hypermetabolic on the PET scan 12/02/2012 3. Dysphagia/subxiphoid pain secondary to #1  4. History of atrial fibrillation-maintained on Coumadin  5. Renal insufficiency  6. History of a CVA  7. 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 Diabetes    Disposition:  Luis Mclean appears stable. I discussed the staging PET scan and reviewed the images with him today. The liver lesion is most likely a metastasis, but it is unusual to have an isolated liver metastasis and no other evidence of metastatic disease. It is  possible the liver lesion represents a primary hepatoma, a metastasis from another site, or a benign lesion.  We discussed delaying treatment in order to biopsy the liver lesion and confirm his clinical stage. He would like to proceed with treatment since he continues to have symptoms related to the primary esophagus mass. I discussed the case with Dr. Mitzi Hansen.  The plan is to proceed with weekly Taxol/carboplatin and radiation today. His case will be presented at the GI tumor conference on 12/08/2012. I will make a referral to interventional radiology for a biopsy of the liver lesion. Luis Mclean is maintained on Coumadin and this will need to be held for the biopsy procedure.  If he is confirmed to have metastatic esophagus cancer we will plan for an abbreviated course of radiation. We will deliver weekly Taxol/carboplatin with radiation and consider additional systemic therapy at the completion of radiation.  Luis Mclean asked me to contact his son to discuss the staging PET scan.  Approximately 30 minutes were spent with patient today. The the majority of the time was used for counseling and coordination of care.   Thornton Papas, MD  12/06/2012  12:36 PM

## 2012-12-06 NOTE — Telephone Encounter (Signed)
Gave pt appt for lab,MD and chemo for October and November 2014 °

## 2012-12-06 NOTE — Progress Notes (Signed)
Blood glucose 421. 10 units regular insulin given. Patient instructed to check blood sugar later today and to check it twice a day for the next couple of days, instructed not to take any steroids next week and to avoid concentrated sweets.  Iformed patient that PCP Dr. Renne Crigler would call with any further instructions regarding insulin.

## 2012-12-06 NOTE — Progress Notes (Signed)
Called lab/infusion area to have CBG done prior to discharge today. Cmet shows glucose level today 448 due to decadron premeds for Taxol. Per Dr. Truett Perna needs SSI from Primary MD.

## 2012-12-07 ENCOUNTER — Encounter (HOSPITAL_COMMUNITY): Payer: Self-pay | Admitting: Pharmacy Technician

## 2012-12-07 ENCOUNTER — Telehealth: Payer: Self-pay | Admitting: *Deleted

## 2012-12-07 ENCOUNTER — Ambulatory Visit
Admission: RE | Admit: 2012-12-07 | Discharge: 2012-12-07 | Disposition: A | Discharge status: Home / Self Care | Payer: Medicare Other | Source: Ambulatory Visit | Attending: Radiation Oncology | Admitting: Radiation Oncology

## 2012-12-07 ENCOUNTER — Other Ambulatory Visit: Payer: Self-pay | Admitting: Oncology

## 2012-12-07 ENCOUNTER — Ambulatory Visit: Payer: Medicare Other

## 2012-12-07 DIAGNOSIS — C159 Malignant neoplasm of esophagus, unspecified: Secondary | ICD-10-CM

## 2012-12-07 DIAGNOSIS — K769 Liver disease, unspecified: Secondary | ICD-10-CM

## 2012-12-07 LAB — CEA: CEA: 4.7 ng/mL (ref 0.0–5.0)

## 2012-12-07 NOTE — Telephone Encounter (Signed)
Spoke with patient and was given name of a Dr. Marko Stai at [facility] that manages his diabetes. Gave her fax [phone] and phone #[phone] to contact. Reports they are quick to respond when they are faxed. He reports his blood sugar this am was 162. Faxed 10/13 office note and labs with request to evaluate for SSI coverage to fax # provided for Dr. Marko Stai.

## 2012-12-07 NOTE — Telephone Encounter (Signed)
ON PT.'S VOICE MAIL ENCOURAGED PT. TO FORCE FLUIDS. ALSO LEFT INSTRUCTIONS TO CONTACT THE ON CALL PHYSICIAN IF ANY PROBLEMS OR CONCERNS ARISE.

## 2012-12-07 NOTE — Telephone Encounter (Signed)
Voice mail from Dr. Carolee Rota office requesting that the physician that manages his blood sugar needs to be called instead of them. He has not been seen there in months.

## 2012-12-08 ENCOUNTER — Other Ambulatory Visit: Payer: Self-pay | Admitting: Radiology

## 2012-12-08 ENCOUNTER — Telehealth: Payer: Self-pay | Admitting: *Deleted

## 2012-12-08 ENCOUNTER — Ambulatory Visit: Payer: Medicare Other

## 2012-12-08 ENCOUNTER — Ambulatory Visit
Admission: RE | Admit: 2012-12-08 | Discharge: 2012-12-08 | Disposition: A | Discharge status: Home / Self Care | Payer: Medicare Other | Source: Ambulatory Visit | Attending: Radiation Oncology | Admitting: Radiation Oncology

## 2012-12-08 NOTE — Telephone Encounter (Signed)
Blood sugar last night after eating was 208. Was 57 this am. Got call from nurse with Dr. Marko Stai today to acknowledge they are aware. Should be returning call soon.

## 2012-12-08 NOTE — Telephone Encounter (Signed)
Left VM for patient to call with last 24 hour glucose readings and let us know if has heard from his physician at [facility] yet regarding SSI (Dr. Marko Stai)?

## 2012-12-09 ENCOUNTER — Ambulatory Visit: Payer: Medicare Other

## 2012-12-09 ENCOUNTER — Other Ambulatory Visit (HOSPITAL_COMMUNITY): Payer: Medicare Other

## 2012-12-09 ENCOUNTER — Other Ambulatory Visit: Payer: Self-pay | Admitting: Radiology

## 2012-12-09 ENCOUNTER — Ambulatory Visit
Admission: RE | Admit: 2012-12-09 | Discharge: 2012-12-09 | Disposition: A | Discharge status: Home / Self Care | Payer: Medicare Other | Source: Ambulatory Visit | Attending: Radiation Oncology | Admitting: Radiation Oncology

## 2012-12-10 ENCOUNTER — Ambulatory Visit: Payer: Medicare Other

## 2012-12-10 ENCOUNTER — Ambulatory Visit (HOSPITAL_COMMUNITY)
Admission: RE | Admit: 2012-12-10 | Discharge: 2012-12-10 | Disposition: A | Discharge status: Home / Self Care | Payer: Medicare Other | Source: Ambulatory Visit | Attending: Oncology | Admitting: Oncology

## 2012-12-10 ENCOUNTER — Ambulatory Visit
Admission: RE | Admit: 2012-12-10 | Discharge: 2012-12-10 | Disposition: A | Discharge status: Home / Self Care | Payer: Medicare Other | Source: Ambulatory Visit | Attending: Radiation Oncology | Admitting: Radiation Oncology

## 2012-12-10 ENCOUNTER — Encounter: Payer: Self-pay | Admitting: Radiation Oncology

## 2012-12-10 ENCOUNTER — Encounter (HOSPITAL_COMMUNITY): Payer: Self-pay

## 2012-12-10 VITALS — BP 132/79 | HR 91 | Temp 98.2°F | Resp 20 | Wt 192.9 lb

## 2012-12-10 DIAGNOSIS — Z794 Long term (current) use of insulin: Secondary | ICD-10-CM | POA: Insufficient documentation

## 2012-12-10 DIAGNOSIS — Z8673 Personal history of transient ischemic attack (TIA), and cerebral infarction without residual deficits: Secondary | ICD-10-CM | POA: Insufficient documentation

## 2012-12-10 DIAGNOSIS — C155 Malignant neoplasm of lower third of esophagus: Secondary | ICD-10-CM

## 2012-12-10 DIAGNOSIS — C159 Malignant neoplasm of esophagus, unspecified: Secondary | ICD-10-CM | POA: Insufficient documentation

## 2012-12-10 DIAGNOSIS — K219 Gastro-esophageal reflux disease without esophagitis: Secondary | ICD-10-CM | POA: Insufficient documentation

## 2012-12-10 DIAGNOSIS — I10 Essential (primary) hypertension: Secondary | ICD-10-CM | POA: Insufficient documentation

## 2012-12-10 DIAGNOSIS — I4891 Unspecified atrial fibrillation: Secondary | ICD-10-CM | POA: Insufficient documentation

## 2012-12-10 DIAGNOSIS — C787 Secondary malignant neoplasm of liver and intrahepatic bile duct: Secondary | ICD-10-CM | POA: Insufficient documentation

## 2012-12-10 DIAGNOSIS — E785 Hyperlipidemia, unspecified: Secondary | ICD-10-CM | POA: Insufficient documentation

## 2012-12-10 DIAGNOSIS — E119 Type 2 diabetes mellitus without complications: Secondary | ICD-10-CM | POA: Insufficient documentation

## 2012-12-10 LAB — CBC
HCT: 39.1 % (ref 39.0–52.0)
Hemoglobin: 13.2 g/dL (ref 13.0–17.0)
MCV: 85.6 fL (ref 78.0–100.0)
RBC: 4.57 MIL/uL (ref 4.22–5.81)
WBC: 8 10*3/uL (ref 4.0–10.5)

## 2012-12-10 LAB — PROTIME-INR
INR: 1.16 (ref 0.00–1.49)
Prothrombin Time: 14.6 seconds (ref 11.6–15.2)

## 2012-12-10 LAB — APTT: aPTT: 29 seconds (ref 24–37)

## 2012-12-10 MED ORDER — HYDROCODONE-ACETAMINOPHEN 5-325 MG PO TABS
1.0000 | ORAL_TABLET | ORAL | Status: DC | PRN
  Filled 2012-12-10: qty 2

## 2012-12-10 MED ORDER — SODIUM CHLORIDE 0.9 % IV SOLN
INTRAVENOUS | Status: DC
  Administered 2012-12-10: 12:00:00 via INTRAVENOUS

## 2012-12-10 MED ORDER — MIDAZOLAM HCL 2 MG/2ML IJ SOLN
INTRAMUSCULAR | Status: AC
  Filled 2012-12-10: qty 4

## 2012-12-10 MED ORDER — MIDAZOLAM HCL 2 MG/2ML IJ SOLN
INTRAMUSCULAR | Status: AC | PRN
  Administered 2012-12-10: 2 mg via INTRAVENOUS

## 2012-12-10 MED ORDER — FENTANYL CITRATE 0.05 MG/ML IJ SOLN
INTRAMUSCULAR | Status: AC
  Filled 2012-12-10: qty 4

## 2012-12-10 MED ORDER — FENTANYL CITRATE 0.05 MG/ML IJ SOLN
INTRAMUSCULAR | Status: AC | PRN
  Administered 2012-12-10: 100 ug via INTRAVENOUS

## 2012-12-10 NOTE — Progress Notes (Signed)
   Department of Radiation Oncology  Phone:  [phone] Fax:        [phone]  Weekly Treatment Note    Name: Luis Mclean Date: 12/10/2012 MRN: [id number] DOB: [birth year]   Current dose: 9 Gy  Current fraction: 5   MEDICATIONS: Current Outpatient Prescriptions  Medication Sig Dispense Refill  . benazepril (LOTENSIN) 40 MG tablet Take 20 mg by mouth every evening. Takes 1/2 tablet      . diphenhydrAMINE (SOMINEX) 25 MG tablet Take 25 mg by mouth at bedtime as needed for sleep.      Marland Kitchen gabapentin (NEURONTIN) 300 MG capsule Take 300 mg by mouth every evening.      . hydrochlorothiazide (HYDRODIURIL) 25 MG tablet Take 12.5 mg by mouth every morning.      . insulin glargine (LANTUS) 100 UNIT/ML injection Inject 50 Units into the skin every morning.      Marland Kitchen levothyroxine (SYNTHROID, LEVOTHROID) 112 MCG tablet Take 112 mcg by mouth daily before breakfast.      . lidocaine-prilocaine (EMLA) cream Apply topically as needed. Apply 1 teaspoon to PAC site 1-2 hours prior to stick and cover with plastic wrap  30 g  prn  . omeprazole (PRILOSEC) 20 MG capsule Take 20 mg by mouth daily.      . prochlorperazine (COMPAZINE) 5 MG tablet Take 1 tablet (5 mg total) by mouth every 6 (six) hours as needed for nausea.  60 tablet  1  . simvastatin (ZOCOR) 40 MG tablet Take 20 mg by mouth every evening.       . warfarin (COUMADIN) 7.5 MG tablet Take 5-7.5 mg by mouth daily. Takes 7.5 mg 4 days a week and 5 mg on Monday Wednesday and friday       No current facility-administered medications for this encounter.   Facility-Administered Medications Ordered in Other Encounters  Medication Dose Route Frequency Provider Last Rate Last Dose  . 0.9 %  sodium chloride infusion   Intravenous Continuous Brayton El, PA-C         ALLERGIES: Review of patient's allergies indicates no known allergies.   LABORATORY DATA:  Lab Results  Component Value Date   WBC 9.4 12/06/2012   HGB 13.7 12/06/2012     HCT 41.4 12/06/2012   MCV 87.5 12/06/2012   PLT 266 12/06/2012   Lab Results  Component Value Date   NA 132* 12/06/2012   K 5.0 12/06/2012   CO2 22 12/06/2012   Lab Results  Component Value Date   ALT 7 12/06/2012   AST 11 12/06/2012   ALKPHOS 89 12/06/2012   BILITOT 0.30 12/06/2012     NARRATIVE: Luis Mclean was seen today for weekly treatment management. The chart was checked and the patient's films were reviewed. The patient has done well with his first week of treatment. He indicates that he is swallowing fairly well. He has a biopsy later today.  PHYSICAL EXAMINATION: weight is 192 lb 14.4 oz (87.499 kg). His oral temperature is 98.2 F (36.8 C). His blood pressure is 132/79 and his pulse is 91. His respiration is 20 and oxygen saturation is 99%.        ASSESSMENT: The patient is doing satisfactorily with treatment.  PLAN: We will continue with the patient's radiation treatment as planned.

## 2012-12-10 NOTE — Progress Notes (Signed)
Gave radiation and therapy book with my business card, to read over the [employer] for s/e,s/s to report, will review more Monday after rad tx

## 2012-12-10 NOTE — Progress Notes (Signed)
Weekly rad tx, esophagus, 5 completed, weakness, didn't sleep well last night, pain esophagus, poor appetite, npo today for biopsy, had chemotherapy Monday, , fatigued, no c/o pain at present, but last night hurt 3-4 hours, didn't take anything for this,  10:13 AM

## 2012-12-10 NOTE — H&P (Signed)
Chief Complaint: "I'm here for a liver biopsy" Referring Physician:Sherrill HPI: Luis Mclean is an 79 y.o. male with esophageal cancer and is found to have a liver lesion on MRI. PET scan confirms this is hypermetabolic concerning for met. He is scheduled for US/CT guided biopsy for staging diagnosis. PMHx and meds reviewed. Stopped Coumadin 5 days ago. Has been NPO since 7am today.  Past Medical History:  Past Medical History  Diagnosis Date  . Hyperlipidemia   . Hypertension   . Stroke   . GERD (gastroesophageal reflux disease)   . Cyst     back of neck  . Esophageal cancer 11/08/12    squmous cell and focal glandular diff  . Diabetes mellitus     type II  . A-fib   . Thyroid disease     Past Surgical History:  Past Surgical History  Procedure Laterality Date  . Cholecystectomy  1989  . Rectal fissure repair  1985  . Back surgery  1989  . Esophageal biopsy  11/08/12    squamous cell & focal glandular diff    Family History:  Family History  Problem Relation Age of Onset  . Cancer Brother     colon  . Cancer Brother     bone  . Cancer Brother     kidney/mets to bone    Social History:  reports that he quit smoking about 15 years ago. His smoking use included Cigars. He has never used smokeless tobacco. He reports that he does not drink alcohol or use illicit drugs.  Allergies: No Known Allergies  Medications:   Medication List    ASK your doctor about these medications       benazepril 40 MG tablet  Commonly known as:  LOTENSIN  Take 20 mg by mouth every evening. Takes 1/2 tablet     diphenhydrAMINE 25 MG tablet  Commonly known as:  SOMINEX  Take 25 mg by mouth at bedtime as needed for sleep.     gabapentin 300 MG capsule  Commonly known as:  NEURONTIN  Take 300 mg by mouth every evening.     hydrochlorothiazide 25 MG tablet  Commonly known as:  HYDRODIURIL  Take 12.5 mg by mouth every morning.     insulin glargine 100 UNIT/ML injection   Commonly known as:  LANTUS  Inject 50 Units into the skin every morning.     levothyroxine 112 MCG tablet  Commonly known as:  SYNTHROID, LEVOTHROID  Take 112 mcg by mouth daily before breakfast.     lidocaine-prilocaine cream  Commonly known as:  EMLA  Apply topically as needed. Apply 1 teaspoon to PAC site 1-2 hours prior to stick and cover with plastic wrap     omeprazole 20 MG capsule  Commonly known as:  PRILOSEC  Take 20 mg by mouth daily.     prochlorperazine 5 MG tablet  Commonly known as:  COMPAZINE  Take 1 tablet (5 mg total) by mouth every 6 (six) hours as needed for nausea.     simvastatin 40 MG tablet  Commonly known as:  ZOCOR  Take 20 mg by mouth every evening.     warfarin 7.5 MG tablet  Commonly known as:  COUMADIN  Take 5-7.5 mg by mouth daily. Takes 7.5 mg 4 days a week and 5 mg on Monday Wednesday and friday        Please HPI for pertinent positives, otherwise complete 10 system ROS negative.  Physical Exam: BP 110/60  Pulse 58  Temp(Src) 97.4 F (36.3 C) (Oral)  Resp 16  SpO2 99% There is no weight on file to calculate BMI.   General Appearance:  Alert, cooperative, no distress, appears stated age  Head:  Normocephalic, without obvious abnormality, atraumatic  ENT: Unremarkable  Neck: Supple, symmetrical, trachea midline  Lungs:   Clear to auscultation bilaterally, no w/r/r, respirations unlabored without use of accessory muscles.  Chest Wall:  No tenderness or deformity  Heart:  Regular rate and rhythm, S1, S2 normal, no murmur, rub or gallop.  Abdomen:   Soft, non-tender, non distended.  Neurologic: Normal affect, no gross deficits.   Results for orders placed during the hospital encounter of 12/10/12 (from the past 48 hour(s))  APTT     Status: None   Collection Time    12/10/12 11:35 AM      Result Value Range   aPTT 29  24 - 37 seconds  CBC     Status: None   Collection Time    12/10/12 11:35 AM      Result Value Range   WBC 8.0   4.0 - 10.5 K/uL   RBC 4.57  4.22 - 5.81 MIL/uL   Hemoglobin 13.2  13.0 - 17.0 g/dL   HCT 16.1  09.6 - 04.5 %   MCV 85.6  78.0 - 100.0 fL   MCH 28.9  26.0 - 34.0 pg   MCHC 33.8  30.0 - 36.0 g/dL   RDW 40.9  81.1 - 91.4 %   Platelets 216  150 - 400 K/uL  PROTIME-INR     Status: None   Collection Time    12/10/12 11:35 AM      Result Value Range   Prothrombin Time 14.6  11.6 - 15.2 seconds   INR 1.16  0.00 - 1.49   No results found.  Assessment/Plan Esophageal cancer Liver lesion. Discussed [place] or CT guided biopsy of liver mass. Explained procedure, risks, complications, use of sedation. Labs reviewed, ok Consent signed in chart  Brayton El PA-C 12/10/2012, 12:27 PM

## 2012-12-10 NOTE — Procedures (Signed)
US guided core biopsies of right hepatic lesion.  4 cores obtained and placed in formalin.  No immediate complication.

## 2012-12-11 ENCOUNTER — Other Ambulatory Visit: Payer: Self-pay | Admitting: Oncology

## 2012-12-13 ENCOUNTER — Ambulatory Visit: Payer: Medicare Other | Admitting: Oncology

## 2012-12-13 ENCOUNTER — Ambulatory Visit
Admission: RE | Admit: 2012-12-13 | Discharge: 2012-12-13 | Disposition: A | Discharge status: Home / Self Care | Payer: Medicare Other | Source: Ambulatory Visit | Attending: Radiation Oncology | Admitting: Radiation Oncology

## 2012-12-13 ENCOUNTER — Other Ambulatory Visit (HOSPITAL_BASED_OUTPATIENT_CLINIC_OR_DEPARTMENT_OTHER): Payer: Medicare Other

## 2012-12-13 ENCOUNTER — Ambulatory Visit (HOSPITAL_BASED_OUTPATIENT_CLINIC_OR_DEPARTMENT_OTHER): Payer: Medicare Other

## 2012-12-13 ENCOUNTER — Other Ambulatory Visit: Payer: Self-pay | Admitting: Oncology

## 2012-12-13 VITALS — BP 123/75 | HR 71 | Temp 96.8°F | Resp 14

## 2012-12-13 DIAGNOSIS — C159 Malignant neoplasm of esophagus, unspecified: Secondary | ICD-10-CM

## 2012-12-13 DIAGNOSIS — C155 Malignant neoplasm of lower third of esophagus: Secondary | ICD-10-CM

## 2012-12-13 DIAGNOSIS — Z5111 Encounter for antineoplastic chemotherapy: Secondary | ICD-10-CM

## 2012-12-13 LAB — CBC WITH DIFFERENTIAL/PLATELET
BASO%: 0.3 % (ref 0.0–2.0)
Basophils Absolute: 0 10*3/uL (ref 0.0–0.1)
Eosinophils Absolute: 0.3 10*3/uL (ref 0.0–0.5)
HGB: 13.3 g/dL (ref 13.0–17.1)
LYMPH%: 20 % (ref 14.0–49.0)
MCV: 86.2 fL (ref 79.3–98.0)
MONO%: 4.8 % (ref 0.0–14.0)
NEUT#: 4.1 10*3/uL (ref 1.5–6.5)
RBC: 4.65 10*6/uL (ref 4.20–5.82)
RDW: 13.9 % (ref 11.0–14.6)
WBC: 5.8 10*3/uL (ref 4.0–10.3)
lymph#: 1.2 10*3/uL (ref 0.9–3.3)
nRBC: 0 % (ref 0–0)

## 2012-12-13 MED ORDER — DIPHENHYDRAMINE HCL 50 MG/ML IJ SOLN
25.0000 mg | Freq: Once | INTRAMUSCULAR | Status: AC
  Administered 2012-12-13: 25 mg via INTRAVENOUS

## 2012-12-13 MED ORDER — SODIUM CHLORIDE 0.9 % IV SOLN
Freq: Once | INTRAVENOUS | Status: AC
  Administered 2012-12-13: 12:00:00 via INTRAVENOUS

## 2012-12-13 MED ORDER — DEXAMETHASONE SODIUM PHOSPHATE 10 MG/ML IJ SOLN
INTRAMUSCULAR | Status: AC
  Filled 2012-12-13: qty 1

## 2012-12-13 MED ORDER — SODIUM CHLORIDE 0.9 % IV SOLN
130.0000 mg | Freq: Once | INTRAVENOUS | Status: AC
  Administered 2012-12-13: 130 mg via INTRAVENOUS
  Filled 2012-12-13: qty 13

## 2012-12-13 MED ORDER — PACLITAXEL CHEMO INJECTION 300 MG/50ML
50.0000 mg/m2 | Freq: Once | INTRAVENOUS | Status: AC
  Administered 2012-12-13: 108 mg via INTRAVENOUS
  Filled 2012-12-13: qty 18

## 2012-12-13 MED ORDER — DIPHENHYDRAMINE HCL 50 MG/ML IJ SOLN
INTRAMUSCULAR | Status: AC
  Filled 2012-12-13: qty 1

## 2012-12-13 MED ORDER — FAMOTIDINE IN NACL 20-0.9 MG/50ML-% IV SOLN
20.0000 mg | Freq: Once | INTRAVENOUS | Status: AC
  Administered 2012-12-13: 20 mg via INTRAVENOUS

## 2012-12-13 MED ORDER — FAMOTIDINE IN NACL 20-0.9 MG/50ML-% IV SOLN
INTRAVENOUS | Status: AC
  Filled 2012-12-13: qty 50

## 2012-12-13 MED ORDER — DEXAMETHASONE SODIUM PHOSPHATE 10 MG/ML IJ SOLN
5.0000 mg | Freq: Once | INTRAMUSCULAR | Status: AC
  Administered 2012-12-13: 5 mg via INTRAVENOUS

## 2012-12-13 MED ORDER — ONDANSETRON 16 MG/50ML IVPB (CHCC)
INTRAVENOUS | Status: AC
  Filled 2012-12-13: qty 16

## 2012-12-13 MED ORDER — ONDANSETRON 16 MG/50ML IVPB (CHCC)
16.0000 mg | Freq: Once | INTRAVENOUS | Status: AC
  Administered 2012-12-13: 16 mg via INTRAVENOUS

## 2012-12-13 NOTE — Patient Instructions (Signed)
[facility] Discharge Instructions for Patients Receiving Chemotherapy  Today you received the following chemotherapy agents TAXOL, CARBOPLATIN   To help prevent nausea and vomiting after your treatment, we encourage you to take your nausea medication STARTING ABOUT 6 PM TONIGHT IF NEEDED   If you develop nausea and vomiting that is not controlled by your nausea medication, call the clinic.   BELOW ARE SYMPTOMS THAT SHOULD BE REPORTED IMMEDIATELY:  *FEVER GREATER THAN 100.5 F  *CHILLS WITH OR WITHOUT FEVER  NAUSEA AND VOMITING THAT IS NOT CONTROLLED WITH YOUR NAUSEA MEDICATION  *UNUSUAL SHORTNESS OF BREATH  *UNUSUAL BRUISING OR BLEEDING  TENDERNESS IN MOUTH AND THROAT WITH OR WITHOUT PRESENCE OF ULCERS  *URINARY PROBLEMS  *BOWEL PROBLEMS  UNUSUAL RASH Items with * indicate a potential emergency and should be followed up as soon as possible.  Feel free to call the clinic you have any questions or concerns. The clinic phone number is [phone].

## 2012-12-14 ENCOUNTER — Telehealth: Payer: Self-pay | Admitting: *Deleted

## 2012-12-14 ENCOUNTER — Ambulatory Visit
Admission: RE | Admit: 2012-12-14 | Discharge: 2012-12-14 | Disposition: A | Discharge status: Home / Self Care | Payer: Medicare Other | Source: Ambulatory Visit | Attending: Radiation Oncology | Admitting: Radiation Oncology

## 2012-12-14 NOTE — Telephone Encounter (Signed)
Message from pt's wife requesting biopsy results. Spoke with Mrs. Oconnor in lobby during pt's radiation appt. Pathology results are not back yet. Will call when they are complete. She voiced understanding.

## 2012-12-15 ENCOUNTER — Ambulatory Visit
Admission: RE | Admit: 2012-12-15 | Discharge: 2012-12-15 | Disposition: A | Discharge status: Home / Self Care | Payer: Medicare Other | Source: Ambulatory Visit | Attending: Radiation Oncology | Admitting: Radiation Oncology

## 2012-12-16 ENCOUNTER — Ambulatory Visit
Admission: RE | Admit: 2012-12-16 | Discharge: 2012-12-16 | Disposition: A | Discharge status: Home / Self Care | Payer: Medicare Other | Source: Ambulatory Visit | Attending: Radiation Oncology | Admitting: Radiation Oncology

## 2012-12-16 ENCOUNTER — Encounter: Payer: Self-pay | Admitting: Radiation Oncology

## 2012-12-16 VITALS — BP 135/93 | HR 81 | Temp 97.6°F | Resp 20 | Wt 193.4 lb

## 2012-12-16 DIAGNOSIS — C155 Malignant neoplasm of lower third of esophagus: Secondary | ICD-10-CM

## 2012-12-16 NOTE — Progress Notes (Signed)
   Department of Radiation Oncology  Phone:  [phone] Fax:        [phone]  Weekly Treatment Note    Name: [name] Date: 12/16/2012 MRN: [id number] DOB: 07-21-1929   Current dose: 16.2 Gy  Current fraction: 9   MEDICATIONS: Current Outpatient Prescriptions  Medication Sig Dispense Refill  . benazepril (LOTENSIN) 40 MG tablet Take 20 mg by mouth every evening. Takes 1/2 tablet      . diphenhydrAMINE (SOMINEX) 25 MG tablet Take 25 mg by mouth at bedtime as needed for sleep.      Marland Kitchen gabapentin (NEURONTIN) 300 MG capsule Take 300 mg by mouth every evening.      . hydrochlorothiazide (HYDRODIURIL) 25 MG tablet Take 12.5 mg by mouth every morning.      . insulin glargine (LANTUS) 100 UNIT/ML injection Inject 50 Units into the skin every morning.      Marland Kitchen levothyroxine (SYNTHROID, LEVOTHROID) 112 MCG tablet Take 112 mcg by mouth daily before breakfast.      . lidocaine-prilocaine (EMLA) cream Apply topically as needed. Apply 1 teaspoon to PAC site 1-2 hours prior to stick and cover with plastic wrap  30 g  prn  . omeprazole (PRILOSEC) 20 MG capsule Take 20 mg by mouth daily.      . prochlorperazine (COMPAZINE) 5 MG tablet Take 1 tablet (5 mg total) by mouth every 6 (six) hours as needed for nausea.  60 tablet  1  . simvastatin (ZOCOR) 40 MG tablet Take 20 mg by mouth every evening.       . warfarin (COUMADIN) 7.5 MG tablet Take 5-7.5 mg by mouth daily. Takes 7.5 mg 4 days a week and 5 mg on Monday Wednesday and friday       No current facility-administered medications for this encounter.     ALLERGIES: Review of patient's allergies indicates no known allergies.   LABORATORY DATA:  Lab Results  Component Value Date   WBC 5.8 12/13/2012   HGB 13.3 12/13/2012   HCT 40.1 12/13/2012   MCV 86.2 12/13/2012   PLT 173 12/13/2012   Lab Results  Component Value Date   NA 132* 12/06/2012   K 5.0 12/06/2012   CO2 22 12/06/2012   Lab Results  Component Value Date   ALT 7 12/06/2012   AST 11 12/06/2012   ALKPHOS 89 12/06/2012   BILITOT 0.30 12/06/2012     NARRATIVE: Luis Mclean was seen today for weekly treatment management. The chart was checked and the patient's films were reviewed. The patient is doing well with treatment. No difficulties with swallowing he states really at this time except for some occasional food getting stuck. The patient had a recent liver biopsy and we discussed the fact that this did show stage IV disease.  PHYSICAL EXAMINATION: weight is 193 lb 6.4 oz (87.726 kg). His oral temperature is 97.6 F (36.4 C). His blood pressure is 135/93 and his pulse is 81. His respiration is 20.        ASSESSMENT: The patient is doing satisfactorily with treatment.  PLAN: We will continue with the patient's radiation treatment as planned. I will try to speed up his radiation treatment plan and I will call the staff about this.

## 2012-12-16 NOTE — Progress Notes (Signed)
Weekly rad txs, 9 tx esophagus poor appetite, swallow=w okay but food does get stuck stated patient, liver bx results from 12/10/12 in Patient haf a cheese danish cofee and juice for breakfast, 1000 am,  pnut butter crackers and milk for lunch fatigued, no c/o pain 2:04 PM

## 2012-12-17 ENCOUNTER — Other Ambulatory Visit: Payer: Self-pay | Admitting: *Deleted

## 2012-12-17 ENCOUNTER — Ambulatory Visit
Admission: RE | Admit: 2012-12-17 | Discharge: 2012-12-17 | Disposition: A | Discharge status: Home / Self Care | Payer: Medicare Other | Source: Ambulatory Visit | Attending: Radiation Oncology | Admitting: Radiation Oncology

## 2012-12-17 DIAGNOSIS — C155 Malignant neoplasm of lower third of esophagus: Secondary | ICD-10-CM

## 2012-12-19 ENCOUNTER — Other Ambulatory Visit: Payer: Self-pay | Admitting: Oncology

## 2012-12-20 ENCOUNTER — Telehealth: Payer: Self-pay | Admitting: Oncology

## 2012-12-20 ENCOUNTER — Ambulatory Visit
Admission: RE | Admit: 2012-12-20 | Discharge: 2012-12-20 | Disposition: A | Discharge status: Home / Self Care | Payer: Medicare Other | Source: Ambulatory Visit | Attending: Radiation Oncology | Admitting: Radiation Oncology

## 2012-12-20 ENCOUNTER — Ambulatory Visit: Payer: Medicare Other | Admitting: Nutrition

## 2012-12-20 ENCOUNTER — Ambulatory Visit (HOSPITAL_BASED_OUTPATIENT_CLINIC_OR_DEPARTMENT_OTHER): Payer: Medicare Other

## 2012-12-20 ENCOUNTER — Ambulatory Visit (HOSPITAL_BASED_OUTPATIENT_CLINIC_OR_DEPARTMENT_OTHER): Payer: Medicare Other | Admitting: Lab

## 2012-12-20 ENCOUNTER — Other Ambulatory Visit (HOSPITAL_BASED_OUTPATIENT_CLINIC_OR_DEPARTMENT_OTHER): Payer: Medicare Other | Admitting: Lab

## 2012-12-20 ENCOUNTER — Ambulatory Visit (HOSPITAL_BASED_OUTPATIENT_CLINIC_OR_DEPARTMENT_OTHER): Payer: Medicare Other | Admitting: Nurse Practitioner

## 2012-12-20 VITALS — BP 152/84 | HR 105 | Temp 97.8°F | Resp 18 | Ht 72.0 in | Wt 192.6 lb

## 2012-12-20 DIAGNOSIS — C155 Malignant neoplasm of lower third of esophagus: Secondary | ICD-10-CM

## 2012-12-20 DIAGNOSIS — C787 Secondary malignant neoplasm of liver and intrahepatic bile duct: Secondary | ICD-10-CM

## 2012-12-20 DIAGNOSIS — C159 Malignant neoplasm of esophagus, unspecified: Secondary | ICD-10-CM

## 2012-12-20 DIAGNOSIS — I4891 Unspecified atrial fibrillation: Secondary | ICD-10-CM

## 2012-12-20 DIAGNOSIS — Z5111 Encounter for antineoplastic chemotherapy: Secondary | ICD-10-CM

## 2012-12-20 LAB — CBC WITH DIFFERENTIAL/PLATELET
Basophils Absolute: 0 10*3/uL (ref 0.0–0.1)
EOS%: 4.6 % (ref 0.0–7.0)
HCT: 36.9 % — ABNORMAL LOW (ref 38.4–49.9)
HGB: 12.4 g/dL — ABNORMAL LOW (ref 13.0–17.1)
LYMPH%: 22.5 % (ref 14.0–49.0)
MCH: 28.8 pg (ref 27.2–33.4)
MCHC: 33.6 g/dL (ref 32.0–36.0)
MCV: 85.6 fL (ref 79.3–98.0)
MONO#: 0.7 10*3/uL (ref 0.1–0.9)
NEUT%: 49.7 % (ref 39.0–75.0)
Platelets: 124 10*3/uL — ABNORMAL LOW (ref 140–400)
lymph#: 0.7 10*3/uL — ABNORMAL LOW (ref 0.9–3.3)
nRBC: 0 % (ref 0–0)

## 2012-12-20 LAB — COMPREHENSIVE METABOLIC PANEL (CC13)
ALT: 10 U/L (ref 0–55)
Alkaline Phosphatase: 68 U/L (ref 40–150)
Anion Gap: 7 mEq/L (ref 3–11)
CO2: 24 mEq/L (ref 22–29)
Chloride: 101 mEq/L (ref 98–109)
Sodium: 132 mEq/L — ABNORMAL LOW (ref 136–145)
Total Bilirubin: 0.43 mg/dL (ref 0.20–1.20)
Total Protein: 7.3 g/dL (ref 6.4–8.3)

## 2012-12-20 MED ORDER — DIPHENHYDRAMINE HCL 50 MG/ML IJ SOLN
25.0000 mg | Freq: Once | INTRAMUSCULAR | Status: AC
  Administered 2012-12-20: 25 mg via INTRAVENOUS

## 2012-12-20 MED ORDER — FAMOTIDINE IN NACL 20-0.9 MG/50ML-% IV SOLN
INTRAVENOUS | Status: AC
  Filled 2012-12-20: qty 50

## 2012-12-20 MED ORDER — ALPRAZOLAM 0.25 MG PO TABS: 0.2500 mg | ORAL_TABLET | Freq: Every evening | ORAL | Status: DC | PRN

## 2012-12-20 MED ORDER — ONDANSETRON 16 MG/50ML IVPB (CHCC)
16.0000 mg | Freq: Once | INTRAVENOUS | Status: AC
  Administered 2012-12-20: 16 mg via INTRAVENOUS

## 2012-12-20 MED ORDER — DEXAMETHASONE SODIUM PHOSPHATE 10 MG/ML IJ SOLN
INTRAMUSCULAR | Status: AC
  Filled 2012-12-20: qty 1

## 2012-12-20 MED ORDER — ONDANSETRON 16 MG/50ML IVPB (CHCC)
INTRAVENOUS | Status: AC
  Filled 2012-12-20: qty 16

## 2012-12-20 MED ORDER — SODIUM CHLORIDE 0.9 % IV SOLN
Freq: Once | INTRAVENOUS | Status: AC
  Administered 2012-12-20: 14:00:00 via INTRAVENOUS

## 2012-12-20 MED ORDER — DEXAMETHASONE SODIUM PHOSPHATE 10 MG/ML IJ SOLN
5.0000 mg | Freq: Once | INTRAMUSCULAR | Status: AC
  Administered 2012-12-20: 5 mg via INTRAVENOUS

## 2012-12-20 MED ORDER — PACLITAXEL CHEMO INJECTION 300 MG/50ML
50.0000 mg/m2 | Freq: Once | INTRAVENOUS | Status: AC
  Administered 2012-12-20: 108 mg via INTRAVENOUS
  Filled 2012-12-20: qty 18

## 2012-12-20 MED ORDER — WARFARIN SODIUM 7.5 MG PO TABS: ORAL_TABLET | ORAL | Status: DC

## 2012-12-20 MED ORDER — HEPARIN SOD (PORK) LOCK FLUSH 100 UNIT/ML IV SOLN
500.0000 [IU] | Freq: Once | INTRAVENOUS | Status: DC | PRN
  Filled 2012-12-20: qty 5

## 2012-12-20 MED ORDER — SODIUM CHLORIDE 0.9 % IV SOLN
130.0000 mg | Freq: Once | INTRAVENOUS | Status: AC
  Administered 2012-12-20: 130 mg via INTRAVENOUS
  Filled 2012-12-20: qty 13

## 2012-12-20 MED ORDER — DIPHENHYDRAMINE HCL 50 MG/ML IJ SOLN
INTRAMUSCULAR | Status: AC
  Filled 2012-12-20: qty 1

## 2012-12-20 MED ORDER — FAMOTIDINE IN NACL 20-0.9 MG/50ML-% IV SOLN
20.0000 mg | Freq: Once | INTRAVENOUS | Status: AC
  Administered 2012-12-20: 20 mg via INTRAVENOUS

## 2012-12-20 MED ORDER — SODIUM CHLORIDE 0.9 % IJ SOLN
10.0000 mL | INTRAMUSCULAR | Status: DC | PRN
  Filled 2012-12-20: qty 10

## 2012-12-20 NOTE — Progress Notes (Signed)
Patient reports he feels well.  He states he feels it is easier to swallow.  He continues a regular diet.  Weight is increased slightly to 192.6 pounds today from 191 pounds October 13.  Glucose noted to be 212 which is an mprovement from glucoses in the 400's several weeks ago.  Nutrition diagnosis: Inadequate oral intake has improved.  Intervention: Patient encouraged continue strategies for adequate calories and protein to promote weight maintenance.  Patient was educated again on high protein food choices.  Questions were answered.  Teach back method used.  Monitoring, evaluation, goals: Patient has tolerated adequate calories and protein for slight weight gain and improved glycemic control.  Next visit: Monday, November 3, during chemotherapy.

## 2012-12-20 NOTE — Progress Notes (Addendum)
OFFICE PROGRESS NOTE  Interval history:  Luis Mclean returns as scheduled. He underwent ultrasound biopsy of a liver lesion on 12/10/2012. 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  He continues radiation and weekly Taxol/carboplatin. He feels he is tolerating the chemotherapy well. He denies nausea/vomiting. No mouth sores. No diarrhea or constipation. No skin rash. He has numbness/tingling in the right foot which predated chemotherapy. There has been no change. He notes difficulty sleeping after the chemotherapy for several nights. He has tried Benadryl with no improvement. He denies dysphagia and odynophagia. He notes mid chest discomfort with "burping".  He reports improved control of blood sugars. He continues a daily insulin injection.  He continues Coumadin. He denies bleeding. He requests that we manage the Coumadin while he is undergoing treatment.   Objective: Blood pressure 152/84, pulse 105, temperature 97.8 F (36.6 C), temperature source Oral, resp. rate 18, height 6' (1.829 m), weight 192 lb 9.6 oz (87.363 kg).  No thrush or ulcerations. Lungs are clear. Irregular cardiac rhythm. Abdomen soft. No hepatomegaly. No leg edema. Calves soft and nontender.  Lab Results: Lab Results  Component Value Date   WBC 3.1* 12/20/2012   HGB 12.4* 12/20/2012   HCT 36.9* 12/20/2012   MCV 85.6 12/20/2012   PLT 124* 12/20/2012    Chemistry:    Chemistry      Component Value Date/Time   NA 132* 12/06/2012 0947   K 5.0 12/06/2012 0947   CO2 22 12/06/2012 0947   BUN 25.7 12/06/2012 0947   CREATININE 1.9* 12/06/2012 0947   GLU 421* 12/06/2012 1503      Component Value Date/Time   CALCIUM 9.8 12/06/2012 0947   ALKPHOS 89 12/06/2012 0947   AST 11 12/06/2012 0947   ALT 7 12/06/2012 0947   BILITOT 0.30 12/06/2012 0947       Studies/Results: Nm Pet Image Initial (pi) Skull Base To Thigh  12/02/2012   CLINICAL DATA:  Initial treatment  strategy for esophageal cancer.  EXAM: NUCLEAR MEDICINE PET SKULL BASE TO THIGH  FASTING BLOOD GLUCOSE:  Value:  214 mg/dl  TECHNIQUE: One 2.3 mCi F-18 FDG was injected intravenously. CT data was obtained and used for attenuation correction and anatomic localization only. (This was not acquired as a diagnostic CT examination.) Additional exam technical data entered on technologist worksheet.  COMPARISON:  10/29/2012  FINDINGS: NECK  No hypermetabolic lymph nodes in the neck.  CHEST  Coronary atherosclerosis. Distal esophageal circumferential mass, maximum standard uptake value of 13.1.  ABDOMEN/PELVIS  Gastrohepatic ligament node adjacent to the gastroesophageal junction has short axis diameter 1.2 cm and maximum standard uptake value of 9.8.  The segment 7 lesion in the right hepatic lobe has elevated metabolic activity compared to the liver, with maximum SUV of 9.6, compatible with metastatic disease.  No other metastatic lesions in the abdomen or pelvis identified. Atherosclerosis noted. Sigmoid diverticulosis. Small left inguinal hernia contains adipose tissue.  SKELETON  No focal hypermetabolic activity to suggest skeletal metastasis.  IMPRESSION: 1. Hypermetabolic distal esophageal mass, with adjacent hypermetabolic adenopathy along the gastrohepatic ligament. 2. The mass in segment 7 of the liver shown on prior MRI is hypermetabolic and most compatible with metastatic disease. 3. Atherosclerosis. 4. Sigmoid diverticulosis.   Electronically Signed   By: Herbie Baltimore M.D.   On: 12/02/2012 15:55   US Biopsy  12/10/2012   CLINICAL DATA:  Esophageal cancer and suspicious liver lesion.  EXAM: ULTRASOUND BIOPSY CORE LIVER  Physician: Rachelle Hora. Lowella Dandy, MD  MEDICATIONS: Versed 2 mg, fentanyl 100 mcg. A radiology nurse monitored the patient for moderate sedation.  ANESTHESIA/SEDATION: Moderate sedation time: 20 min  FLUOROSCOPY TIME:  None  PROCEDURE: The procedure was explained to the patient. The risks and  benefits of the procedure were discussed and the patient's questions were addressed. Informed consent was obtained from the patient. The liver was evaluated with ultrasound. Hyperechoic lesion in the upper right hepatic lobe was identified with ultrasound. The right upper abdomen was prepped and draped in a sterile fashion. The skin and liver capsule were anesthetized with 1% lidocaine. Using ultrasound guidance, a 17 gauge needle was directed towards the hyperechoic lesion. Needle position was confirmed within the lesion. Four core biopsies were obtained with an 18 gauge core device. Specimens were placed in formalin. 17 gauge needle was removed without complication.  COMPLICATIONS: None  FINDINGS: Hyperechoic lesion in the upper right hepatic lobe. This lesion corresponds with the lesion identified on the recent MRI. Needle position was confirmed within the lesion.  IMPRESSION: Successful ultrasound-guided core biopsies of the right hepatic lesion.   Electronically Signed   By: Richarda Overlie M.D.   On: 12/10/2012 15:53    Medications: I have reviewed the patient's current medications.  Assessment/Plan:  1. 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 Initiation of radiation and weekly Taxol/carboplatin 12/06/2012. 2. Indeterminate right hepatic mass on ultrasound-10/20/2012/MRI-10/29/2012 , hypermetabolic on the PET scan 12/02/2012. 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  3. Dysphagia/subxiphoid pain secondary to #1. Improved.  4. History of atrial fibrillation-maintained on Coumadin.  5. Renal insufficiency. 6. History of a CVA.  7. 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  8. Diabetes. 9. Difficulty sleeping  following chemotherapy. Likely related to the Decadron premedication. He has tried Benadryl with no improvement. We gave him a prescription for Xanax 0.25 mg at bedtime as needed.  Disposition-Luis Mclean appears stable. The dysphagia continues to be improved. He appears to be tolerating the radiation and chemotherapy well.   Dr. Truett Perna reviewed the pathology report from the liver biopsy with Luis Mclean and his wife. They understand he has metastatic disease. Dr. Truett Perna will discuss abbreviating the course of radiation with Dr. Mitzi Hansen. Dr. Truett Perna recommends proceeding with the weekly Taxol/carboplatin today as planned and then discontinuing the chemotherapy.  Luis Mclean requested that we manage his Coumadin. He will return to the lab today for a PT/INR. He understands to continue the current dose of 5 mg Monday Wednesday Friday and 7.5 mg all other days unless we contact him with other instructions.  He will return for a followup visit with Dr. Truett Perna on 01/04/2013. He will contact the office in the interim with any problems.    30 minutes were spent face-to-face at today's visit with the majority of that time spent in counseling/coordination of care.    Patient seen with Dr. Truett Perna.   Lonna Cobb ANP/GNP-BC  This was a shared visit with Lonna Cobb. The liver biopsy confirmed metastatic esophagus cancer. I discussed the biopsy findings and treatment options with Luis Mclean and his wife. He will complete a final treatment with Taxol/carboplatin today. I will discuss the case with Dr. Mitzi Hansen with the likely plan to curtail the planned course of definitive radiation.  Luis Mclean will return for an office visit in approximately 2 weeks  to discuss systemic treatment options.  Mancel Bale, M.D.

## 2012-12-20 NOTE — Telephone Encounter (Signed)
pt will come back tomorrow and pick up sched

## 2012-12-20 NOTE — Patient Instructions (Signed)
[facility] Discharge Instructions for Patients Receiving Chemotherapy  Today you received the following chemotherapy agents Taxol/Carboplatin  To help prevent nausea and vomiting after your treatment, we encourage you to take your nausea medication as directed.   If you develop nausea and vomiting that is not controlled by your nausea medication, call the clinic.   BELOW ARE SYMPTOMS THAT SHOULD BE REPORTED IMMEDIATELY:  *FEVER GREATER THAN 100.5 F  *CHILLS WITH OR WITHOUT FEVER  NAUSEA AND VOMITING THAT IS NOT CONTROLLED WITH YOUR NAUSEA MEDICATION  *UNUSUAL SHORTNESS OF BREATH  *UNUSUAL BRUISING OR BLEEDING  TENDERNESS IN MOUTH AND THROAT WITH OR WITHOUT PRESENCE OF ULCERS  *URINARY PROBLEMS  *BOWEL PROBLEMS  UNUSUAL RASH Items with * indicate a potential emergency and should be followed up as soon as possible.  Feel free to call the clinic you have any questions or concerns. The clinic phone number is [phone].    

## 2012-12-21 ENCOUNTER — Ambulatory Visit
Admission: RE | Admit: 2012-12-21 | Discharge: 2012-12-21 | Disposition: A | Discharge status: Home / Self Care | Payer: Medicare Other | Source: Ambulatory Visit | Attending: Radiation Oncology | Admitting: Radiation Oncology

## 2012-12-22 ENCOUNTER — Ambulatory Visit
Admission: RE | Admit: 2012-12-22 | Discharge: 2012-12-22 | Disposition: A | Discharge status: Home / Self Care | Payer: Medicare Other | Source: Ambulatory Visit | Attending: Radiation Oncology | Admitting: Radiation Oncology

## 2012-12-22 ENCOUNTER — Encounter: Payer: Self-pay | Admitting: Radiation Oncology

## 2012-12-23 ENCOUNTER — Ambulatory Visit
Admission: RE | Admit: 2012-12-23 | Discharge: 2012-12-23 | Disposition: A | Discharge status: Home / Self Care | Payer: Medicare Other | Source: Ambulatory Visit | Attending: Radiation Oncology | Admitting: Radiation Oncology

## 2012-12-24 ENCOUNTER — Ambulatory Visit
Admission: RE | Admit: 2012-12-24 | Discharge: 2012-12-24 | Disposition: A | Discharge status: Home / Self Care | Payer: Medicare Other | Source: Ambulatory Visit | Attending: Radiation Oncology | Admitting: Radiation Oncology

## 2012-12-24 ENCOUNTER — Encounter: Payer: Self-pay | Admitting: Radiation Oncology

## 2012-12-24 VITALS — BP 146/96 | HR 97 | Temp 97.5°F | Resp 20 | Wt 192.2 lb

## 2012-12-24 DIAGNOSIS — C155 Malignant neoplasm of lower third of esophagus: Secondary | ICD-10-CM

## 2012-12-24 MED ORDER — SUCRALFATE 1 G PO TABS: 1.0000 g | ORAL_TABLET | Freq: Four times a day (QID) | ORAL | Status: DC

## 2012-12-24 NOTE — Progress Notes (Signed)
Weekly rad tx esohagus 2/10 complted, c/o heartburn at throat,tums not helping stated patient, appetite poor, bowl cereal rice krispies chocolate this am, 1/3 cup coffee, milk too, not feeling that food gets stuck though, energy level poor too 1:24 PM

## 2012-12-24 NOTE — Progress Notes (Signed)
   Department of Radiation Oncology  Phone:  [phone] Fax:        [phone]  Weekly Treatment Note    Name: Luis Mclean Date: 12/24/2012 MRN: [id number] DOB: March 02, 1929     Current fraction: 15   MEDICATIONS: Current Outpatient Prescriptions  Medication Sig Dispense Refill  . ALPRAZolam (XANAX) 0.25 MG tablet Take 1 tablet (0.25 mg total) by mouth at bedtime as needed for sleep.  30 tablet  0  . benazepril (LOTENSIN) 40 MG tablet Take 20 mg by mouth every evening. Takes 1/2 tablet      . diphenhydrAMINE (SOMINEX) 25 MG tablet Take 25 mg by mouth at bedtime as needed for sleep.      Marland Kitchen gabapentin (NEURONTIN) 300 MG capsule Take 300 mg by mouth every evening.      . hydrochlorothiazide (HYDRODIURIL) 25 MG tablet Take 12.5 mg by mouth every morning.      . insulin glargine (LANTUS) 100 UNIT/ML injection Inject 50 Units into the skin every morning.      Marland Kitchen levothyroxine (SYNTHROID, LEVOTHROID) 112 MCG tablet Take 112 mcg by mouth daily before breakfast.      . lidocaine-prilocaine (EMLA) cream Apply topically as needed. Apply 1 teaspoon to PAC site 1-2 hours prior to stick and cover with plastic wrap  30 g  prn  . omeprazole (PRILOSEC) 20 MG capsule Take 20 mg by mouth daily.      . prochlorperazine (COMPAZINE) 5 MG tablet Take 1 tablet (5 mg total) by mouth every 6 (six) hours as needed for nausea.  60 tablet  1  . simvastatin (ZOCOR) 40 MG tablet Take 20 mg by mouth every evening.       . warfarin (COUMADIN) 7.5 MG tablet Takes 7.5 mg 4 days a week and 5 mg on Monday Wednesday and friday  45 tablet  0   No current facility-administered medications for this encounter.     ALLERGIES: Review of patient's allergies indicates no known allergies.   LABORATORY DATA:  Lab Results  Component Value Date   WBC 3.1* 12/20/2012   HGB 12.4* 12/20/2012   HCT 36.9* 12/20/2012   MCV 85.6 12/20/2012   PLT 124* 12/20/2012   Lab Results  Component Value Date   NA 132*  12/20/2012   K 4.9 12/20/2012   CO2 24 12/20/2012   Lab Results  Component Value Date   ALT 10 12/20/2012   AST 19 12/20/2012   ALKPHOS 68 12/20/2012   BILITOT 0.43 12/20/2012     NARRATIVE: Luis Mclean was seen today for weekly treatment management. The chart was checked and the patient's films were reviewed. The patient is doing well overall. He is complaining of some esophagitis however. He takes omeprazole daily but would benefit it sounds from Carafate.  PHYSICAL EXAMINATION: weight is 192 lb 3.2 oz (87.181 kg). His oral temperature is 97.5 F (36.4 C). His blood pressure is 146/96 and his pulse is 97. His respiration is 20.        ASSESSMENT: The patient is doing satisfactorily with treatment.  PLAN: We will continue with the patient's radiation treatment as planned.

## 2012-12-27 ENCOUNTER — Encounter: Payer: Medicare Other | Admitting: Nutrition

## 2012-12-27 ENCOUNTER — Ambulatory Visit
Admission: RE | Admit: 2012-12-27 | Discharge: 2012-12-27 | Disposition: A | Discharge status: Home / Self Care | Payer: Medicare Other | Source: Ambulatory Visit | Attending: Radiation Oncology | Admitting: Radiation Oncology

## 2012-12-27 ENCOUNTER — Telehealth: Payer: Self-pay | Admitting: *Deleted

## 2012-12-27 ENCOUNTER — Ambulatory Visit: Payer: Medicare Other

## 2012-12-27 ENCOUNTER — Other Ambulatory Visit (HOSPITAL_BASED_OUTPATIENT_CLINIC_OR_DEPARTMENT_OTHER): Payer: Medicare Other

## 2012-12-27 DIAGNOSIS — I4891 Unspecified atrial fibrillation: Secondary | ICD-10-CM

## 2012-12-27 LAB — PROTIME-INR: INR: 1.7 — ABNORMAL LOW (ref 2.00–3.50)

## 2012-12-27 NOTE — Telephone Encounter (Signed)
Called pt re: INR results. He reports he missed a dose this week. INR reviewed by Dr. Truett Perna: Continue same dose of Coumadin. 5 mg MWF 7.5 mg all other days. Pt voiced understanding. Will recheck INR on 11/10.

## 2012-12-28 ENCOUNTER — Ambulatory Visit
Admission: RE | Admit: 2012-12-28 | Discharge: 2012-12-28 | Disposition: A | Discharge status: Home / Self Care | Payer: Medicare Other | Source: Ambulatory Visit | Attending: Radiation Oncology | Admitting: Radiation Oncology

## 2012-12-29 ENCOUNTER — Encounter: Payer: Self-pay | Admitting: Radiation Oncology

## 2012-12-29 ENCOUNTER — Ambulatory Visit
Admission: RE | Admit: 2012-12-29 | Discharge: 2012-12-29 | Disposition: A | Discharge status: Home / Self Care | Payer: Medicare Other | Source: Ambulatory Visit | Attending: Radiation Oncology | Admitting: Radiation Oncology

## 2012-12-29 VITALS — BP 127/85 | HR 104 | Temp 97.3°F | Resp 20 | Wt 193.3 lb

## 2012-12-29 DIAGNOSIS — C155 Malignant neoplasm of lower third of esophagus: Secondary | ICD-10-CM

## 2012-12-29 NOTE — Progress Notes (Signed)
Weekly rad tx esophagus, 17/ completed, had cereal ,coffe, gatorade today, very weak, unsteady, 2 asit to weigh pt, past 3 days weaker stated, heart burn at night medicatioon helps 10:49 AM

## 2012-12-29 NOTE — Progress Notes (Signed)
   Department of Radiation Oncology  Phone:  [phone] Fax:        [phone]  Weekly Treatment Note    Name: Luis Mclean Date: 12/29/2012 MRN: [id number] DOB: Aug 23, 1929     Current fraction: 17   MEDICATIONS: Current Outpatient Prescriptions  Medication Sig Dispense Refill  . ALPRAZolam (XANAX) 0.25 MG tablet Take 1 tablet (0.25 mg total) by mouth at bedtime as needed for sleep.  30 tablet  0  . benazepril (LOTENSIN) 40 MG tablet Take 20 mg by mouth every evening. Takes 1/2 tablet      . diphenhydrAMINE (SOMINEX) 25 MG tablet Take 25 mg by mouth at bedtime as needed for sleep.      Marland Kitchen gabapentin (NEURONTIN) 300 MG capsule Take 300 mg by mouth every evening.      . hydrochlorothiazide (HYDRODIURIL) 25 MG tablet Take 12.5 mg by mouth every morning.      . insulin glargine (LANTUS) 100 UNIT/ML injection Inject 50 Units into the skin every morning.      Marland Kitchen levothyroxine (SYNTHROID, LEVOTHROID) 112 MCG tablet Take 112 mcg by mouth daily before breakfast.      . lidocaine-prilocaine (EMLA) cream Apply topically as needed. Apply 1 teaspoon to PAC site 1-2 hours prior to stick and cover with plastic wrap  30 g  prn  . omeprazole (PRILOSEC) 20 MG capsule Take 20 mg by mouth daily.      . prochlorperazine (COMPAZINE) 5 MG tablet Take 1 tablet (5 mg total) by mouth every 6 (six) hours as needed for nausea.  60 tablet  1  . simvastatin (ZOCOR) 40 MG tablet Take 20 mg by mouth every evening.       . sucralfate (CARAFATE) 1 G tablet Take 1 tablet (1 g total) by mouth 4 (four) times daily.  120 tablet  2  . warfarin (COUMADIN) 7.5 MG tablet Takes 7.5 mg 4 days a week and 5 mg on Monday Wednesday and friday  45 tablet  0   No current facility-administered medications for this encounter.     ALLERGIES: Review of patient's allergies indicates no known allergies.   LABORATORY DATA:  Lab Results  Component Value Date   WBC 3.1* 12/20/2012   HGB 12.4* 12/20/2012   HCT 36.9*  12/20/2012   MCV 85.6 12/20/2012   PLT 124* 12/20/2012   Lab Results  Component Value Date   NA 132* 12/20/2012   K 4.9 12/20/2012   CO2 24 12/20/2012   Lab Results  Component Value Date   ALT 10 12/20/2012   AST 19 12/20/2012   ALKPHOS 68 12/20/2012   BILITOT 0.43 12/20/2012     NARRATIVE: Luis Mclean was seen today for weekly treatment management. The chart was checked and the patient's films were reviewed. The patient is seen prior to his treatment today. He is doing well except for significant fatigue. He has been drinking plenty of fluids and has been able to swallow well over the last few days. No significant complaints of worsening esophagitis.  PHYSICAL EXAMINATION: weight is 193 lb 4.8 oz (87.68 kg). His oral temperature is 97.3 F (36.3 C). His blood pressure is 127/85 and his pulse is 104. His respiration is 20 and oxygen saturation is 100%.        ASSESSMENT: The patient is doing satisfactorily with treatment.  PLAN: We will continue with the patient's radiation treatment as planned.

## 2012-12-30 ENCOUNTER — Ambulatory Visit
Admission: RE | Admit: 2012-12-30 | Discharge: 2012-12-30 | Disposition: A | Discharge status: Home / Self Care | Payer: Medicare Other | Source: Ambulatory Visit | Attending: Radiation Oncology | Admitting: Radiation Oncology

## 2012-12-31 ENCOUNTER — Ambulatory Visit
Admission: RE | Admit: 2012-12-31 | Discharge: 2012-12-31 | Disposition: A | Discharge status: Home / Self Care | Payer: Medicare Other | Source: Ambulatory Visit | Attending: Radiation Oncology | Admitting: Radiation Oncology

## 2013-01-03 ENCOUNTER — Ambulatory Visit: Payer: Medicare Other | Admitting: Nutrition

## 2013-01-03 ENCOUNTER — Ambulatory Visit (HOSPITAL_BASED_OUTPATIENT_CLINIC_OR_DEPARTMENT_OTHER): Payer: Medicare Other

## 2013-01-03 ENCOUNTER — Ambulatory Visit
Admission: RE | Admit: 2013-01-03 | Discharge: 2013-01-03 | Disposition: A | Discharge status: Home / Self Care | Payer: Medicare Other | Source: Ambulatory Visit | Attending: Radiation Oncology | Admitting: Radiation Oncology

## 2013-01-03 ENCOUNTER — Other Ambulatory Visit: Payer: Self-pay | Admitting: Radiation Oncology

## 2013-01-03 ENCOUNTER — Encounter: Payer: Self-pay | Admitting: *Deleted

## 2013-01-03 ENCOUNTER — Ambulatory Visit: Payer: Medicare Other

## 2013-01-03 ENCOUNTER — Encounter: Payer: Self-pay | Admitting: Radiation Oncology

## 2013-01-03 ENCOUNTER — Other Ambulatory Visit (HOSPITAL_BASED_OUTPATIENT_CLINIC_OR_DEPARTMENT_OTHER): Payer: Medicare Other

## 2013-01-03 VITALS — BP 106/57 | HR 111 | Resp 20

## 2013-01-03 VITALS — BP 86/56 | HR 95 | Temp 97.6°F | Resp 20 | Wt 188.2 lb

## 2013-01-03 DIAGNOSIS — C159 Malignant neoplasm of esophagus, unspecified: Secondary | ICD-10-CM

## 2013-01-03 DIAGNOSIS — C155 Malignant neoplasm of lower third of esophagus: Secondary | ICD-10-CM

## 2013-01-03 DIAGNOSIS — R5381 Other malaise: Secondary | ICD-10-CM

## 2013-01-03 DIAGNOSIS — I4891 Unspecified atrial fibrillation: Secondary | ICD-10-CM

## 2013-01-03 LAB — COMPREHENSIVE METABOLIC PANEL (CC13)
ALT: 9 U/L (ref 0–55)
AST: 18 U/L (ref 5–34)
Albumin: 2.7 g/dL — ABNORMAL LOW (ref 3.5–5.0)
Anion Gap: 8 mEq/L (ref 3–11)
CO2: 22 mEq/L (ref 22–29)
Calcium: 9.2 mg/dL (ref 8.4–10.4)
Chloride: 97 mEq/L — ABNORMAL LOW (ref 98–109)
Creatinine: 1.6 mg/dL — ABNORMAL HIGH (ref 0.7–1.3)
Potassium: 4.8 mEq/L (ref 3.5–5.1)
Total Protein: 5.9 g/dL — ABNORMAL LOW (ref 6.4–8.3)

## 2013-01-03 LAB — PROTIME-INR: INR: 2.7 (ref 2.00–3.50)

## 2013-01-03 MED ORDER — SODIUM CHLORIDE 0.9 % IV SOLN
INTRAVENOUS | Status: DC
  Administered 2013-01-03: 16:00:00 via INTRAVENOUS

## 2013-01-03 NOTE — Progress Notes (Signed)
Took ortho vitals sitting T=97.6, b/p=118/68,p=100,rr=20 Standing b/p=86/56,p=95, loss 5 lbs since last week,no strength left at all, may need IVF's 2:07 PM

## 2013-01-03 NOTE — Patient Instructions (Signed)
Dehydration, Adult Dehydration is when you lose more fluids from the body than you take in. Vital organs like the kidneys, brain, and heart cannot function without a proper amount of fluids and salt. Any loss of fluids from the body can cause dehydration.  CAUSES   Vomiting.  Diarrhea.  Excessive sweating.  Excessive urine output.  Fever. SYMPTOMS  Mild dehydration  Thirst.  Dry lips.  Slightly dry mouth. Moderate dehydration  Very dry mouth.  Sunken eyes.  Skin does not bounce back quickly when lightly pinched and released.  Dark urine and decreased urine production.  Decreased tear production.  Headache. Severe dehydration  Very dry mouth.  Extreme thirst.  Rapid, weak pulse (more than 100 beats per minute at rest).  Cold hands and feet.  Not able to sweat in spite of heat and temperature.  Rapid breathing.  Blue lips.  Confusion and lethargy.  Difficulty being awakened.  Minimal urine production.  No tears. DIAGNOSIS  Your caregiver will diagnose dehydration based on your symptoms and your exam. Blood and urine tests will help confirm the diagnosis. The diagnostic evaluation should also identify the cause of dehydration. TREATMENT  Treatment of mild or moderate dehydration can often be done at home by increasing the amount of fluids that you drink. It is best to drink small amounts of fluid more often. Drinking too much at one time can make vomiting worse. Refer to the home care instructions below. Severe dehydration needs to be treated at the hospital where you will probably be given intravenous (IV) fluids that contain water and electrolytes. HOME CARE INSTRUCTIONS   Ask your caregiver about specific rehydration instructions.  Drink enough fluids to keep your urine clear or pale yellow.  Drink small amounts frequently if you have nausea and vomiting.  Eat as you normally do.  Avoid:  Foods or drinks high in sugar.  Carbonated  drinks.  Juice.  Extremely hot or cold fluids.  Drinks with caffeine.  Fatty, greasy foods.  Alcohol.  Tobacco.  Overeating.  Gelatin desserts.  Wash your hands well to avoid spreading bacteria and viruses.  Only take over-the-counter or prescription medicines for pain, discomfort, or fever as directed by your caregiver.  Ask your caregiver if you should continue all prescribed and over-the-counter medicines.  Keep all follow-up appointments with your caregiver. SEEK MEDICAL CARE IF:  You have abdominal pain and it increases or stays in one area (localizes).  You have a rash, stiff neck, or severe headache.  You are irritable, sleepy, or difficult to awaken.  You are weak, dizzy, or extremely thirsty. SEEK IMMEDIATE MEDICAL CARE IF:   You are unable to keep fluids down or you get worse despite treatment.  You have frequent episodes of vomiting or diarrhea.  You have blood or green matter (bile) in your vomit.  You have blood in your stool or your stool looks black and tarry.  You have not urinated in 6 to 8 hours, or you have only urinated a small amount of very dark urine.  You have a fever.  You faint. MAKE SURE YOU:   Understand these instructions.  Will watch your condition.  Will get help right away if you are not doing well or get worse. Document Released: 02/10/2005 Document Revised: 05/05/2011 Document Reviewed: 09/30/2010 [employer] Patient Information 2014 [employer].  

## 2013-01-03 NOTE — Progress Notes (Signed)
CC: Dr. Dorothy Puffer   Weekly Management Note:  Site: Esophagus Current Dose:  3100  cGy Projected Dose: 3540  cGy  Narrative: The patient is seen today for routine under treatment assessment. CBCT/MVCT images/port films were reviewed. The chart was reviewed.   He seen today complaining of severe fatigue. His fatigue has significantly worsened over the past week. He does have mild nausea but has taken Compazine when necessary. He tells me he is to tired to drink or eat.  Physical Examination:  Filed Vitals:   01/03/13 1400  BP: 86/56  Pulse: 95  Temp:   Resp: 20  .  Weight: 188 lb 3.2 oz (85.367 kg). He is pale, appears fatigued. Abdomen: Soft and nontender.  CMP     Component Value Date/Time   NA 132* 12/20/2012 1051   K 4.9 12/20/2012 1051   CO2 24 12/20/2012 1051   GLUCOSE 124 12/20/2012 1051   BUN 18.4 12/20/2012 1051   CREATININE 1.3 12/20/2012 1051   CALCIUM 10.1 12/20/2012 1051   PROT 7.3 12/20/2012 1051   ALBUMIN 3.2* 12/20/2012 1051   AST 19 12/20/2012 1051   ALT 10 12/20/2012 1051   ALKPHOS 68 12/20/2012 1051   BILITOT 0.43 12/20/2012 1051     Impression: Tolerating radiation therapy well, with the exception of severe fatigue. I'm not sure if he has metabolic issues. He is a has diminished by mouth intake. He is dehydrated. I will get a chemistry profile today and also give him 1 L of normal saline IV over 3 hours.  Plan: Continue radiation therapy as planned.

## 2013-01-03 NOTE — Progress Notes (Unsigned)
Wife came to nursing  concerned about her husband, stating "she called Dr.Sherrill's office this am around 10:00 and left message to call her about patient getting weaker, and unable to stand except for a couple steps, only drinking gator ade and water this am, no bowel movements several days,, he either has diarrhea or constipation,  he is just getting worse,and I feel he needs fluids", will have patient come around to see on call MD, wife has just been sponge bathing patient, unable to get to bathroom, will get orthostatics and weight, 1:51 PM

## 2013-01-03 NOTE — Progress Notes (Signed)
I spoke with patient and wife briefly in the chemotherapy room while patient was awaiting IV fluids.  He complains of weakness and fatigue.  Weight has decreased to 188.2 pounds November 10 from 193.3 pounds November 5.  Patient has been drinking Gatorade.  He occasionally drinks boost plus once a day.  Labs reviewed and noted:  Sodium decreased at 127, Creatinine 1.6, glucose 366.    Nutrition diagnosis: Inadequate oral intake continues.  Intervention: Patient encouraged increased fluid intake along with intake of boost plus as tolerated.  Patient verbalizes understanding.  Monitoring, evaluation, goals: Patient has had decreased oral intake with 5 pound weight change over the past week.  Patient will work to increase fluid and oral intake.  Next visit: To follow patient as needed.

## 2013-01-03 NOTE — Progress Notes (Signed)
Escorted patient to lab upstairs, will get C-met and NS 1 liter NS Ivf's x 1 per Dr.Murry who put ordrs in,had called and left message on schedulers [place] phone, tosee if patient can get fluids upstairs, waiting on call back 2:31 PM  Amy,Charge nurse called, can fit patient in at 315pm, will let wife and patient know 2:31 PM

## 2013-01-04 ENCOUNTER — Ambulatory Visit
Admission: RE | Admit: 2013-01-04 | Discharge: 2013-01-04 | Disposition: A | Discharge status: Home / Self Care | Payer: Medicare Other | Source: Ambulatory Visit | Attending: Radiation Oncology | Admitting: Radiation Oncology

## 2013-01-05 ENCOUNTER — Ambulatory Visit
Admission: RE | Admit: 2013-01-05 | Discharge: 2013-01-05 | Disposition: A | Discharge status: Home / Self Care | Payer: Medicare Other | Source: Ambulatory Visit | Attending: Radiation Oncology | Admitting: Radiation Oncology

## 2013-01-05 ENCOUNTER — Encounter: Payer: Self-pay | Admitting: Radiation Oncology

## 2013-01-05 ENCOUNTER — Telehealth: Payer: Self-pay | Admitting: *Deleted

## 2013-01-05 NOTE — Telephone Encounter (Signed)
Message copied by Raphael Gibney on Wed Jan 05, 2013 10:58 AM ------      Message from: [place], [place] K      Created: Wed Jan 05, 2013 10:48 AM       Please call patient with instructions to continue same dose Coumadin. Thanks.        ----- Message -----         From: [facility] Interface         Sent: 01/03/2013   1:00 PM           To: Rana Snare, NP                   ------

## 2013-01-05 NOTE — Telephone Encounter (Signed)
Called patient and gave instructions to continue the same dose of coumadin. 5 mg MWF and 7.5 mg every other day. Per Ebbie Ridge. Thomas, ANP.  Patient verbalized understanding.

## 2013-01-06 ENCOUNTER — Other Ambulatory Visit: Payer: Self-pay | Admitting: *Deleted

## 2013-01-06 ENCOUNTER — Telehealth: Payer: Self-pay | Admitting: Oncology

## 2013-01-06 ENCOUNTER — Ambulatory Visit (HOSPITAL_BASED_OUTPATIENT_CLINIC_OR_DEPARTMENT_OTHER): Payer: Medicare Other

## 2013-01-06 ENCOUNTER — Ambulatory Visit (HOSPITAL_BASED_OUTPATIENT_CLINIC_OR_DEPARTMENT_OTHER): Payer: Medicare Other | Admitting: Oncology

## 2013-01-06 ENCOUNTER — Ambulatory Visit: Payer: Medicare Other

## 2013-01-06 ENCOUNTER — Encounter: Payer: Self-pay | Admitting: Oncology

## 2013-01-06 ENCOUNTER — Other Ambulatory Visit (HOSPITAL_BASED_OUTPATIENT_CLINIC_OR_DEPARTMENT_OTHER): Payer: Medicare Other | Admitting: Lab

## 2013-01-06 ENCOUNTER — Telehealth: Payer: Self-pay | Admitting: *Deleted

## 2013-01-06 VITALS — BP 105/60 | HR 82 | Temp 97.7°F | Resp 17 | Ht 72.0 in | Wt 188.7 lb

## 2013-01-06 DIAGNOSIS — I4891 Unspecified atrial fibrillation: Secondary | ICD-10-CM

## 2013-01-06 DIAGNOSIS — N289 Disorder of kidney and ureter, unspecified: Secondary | ICD-10-CM

## 2013-01-06 DIAGNOSIS — C787 Secondary malignant neoplasm of liver and intrahepatic bile duct: Secondary | ICD-10-CM

## 2013-01-06 DIAGNOSIS — C155 Malignant neoplasm of lower third of esophagus: Secondary | ICD-10-CM

## 2013-01-06 DIAGNOSIS — C159 Malignant neoplasm of esophagus, unspecified: Secondary | ICD-10-CM

## 2013-01-06 DIAGNOSIS — I482 Chronic atrial fibrillation, unspecified: Secondary | ICD-10-CM

## 2013-01-06 DIAGNOSIS — R1310 Dysphagia, unspecified: Secondary | ICD-10-CM

## 2013-01-06 DIAGNOSIS — R5381 Other malaise: Secondary | ICD-10-CM

## 2013-01-06 LAB — CBC WITH DIFFERENTIAL/PLATELET
BASO%: 0.3 % (ref 0.0–2.0)
Basophils Absolute: 0 10*3/uL (ref 0.0–0.1)
Eosinophils Absolute: 0.1 10*3/uL (ref 0.0–0.5)
HGB: 11.6 g/dL — ABNORMAL LOW (ref 13.0–17.1)
MCH: 28.6 pg (ref 27.2–33.4)
MCV: 84.9 fL (ref 79.3–98.0)
MONO%: 10.6 % (ref 0.0–14.0)
NEUT#: 2.8 10*3/uL (ref 1.5–6.5)
Platelets: 135 10*3/uL — ABNORMAL LOW (ref 140–400)
RBC: 4.05 10*6/uL — ABNORMAL LOW (ref 4.20–5.82)
RDW: 15 % — ABNORMAL HIGH (ref 11.0–14.6)
nRBC: 0 % (ref 0–0)

## 2013-01-06 LAB — BASIC METABOLIC PANEL (CC13)
BUN: 20.9 mg/dL (ref 7.0–26.0)
CO2: 22 mEq/L (ref 22–29)
Calcium: 8.9 mg/dL (ref 8.4–10.4)
Glucose: 303 mg/dl — ABNORMAL HIGH (ref 70–140)
Potassium: 4.4 mEq/L (ref 3.5–5.1)
Sodium: 130 mEq/L — ABNORMAL LOW (ref 136–145)

## 2013-01-06 LAB — PROTHROMBIN TIME: Prothrombin Time: 41.8 seconds — ABNORMAL HIGH (ref 11.6–15.2)

## 2013-01-06 MED ORDER — ONDANSETRON 8 MG/50ML IVPB (CHCC)
8.0000 mg | Freq: Once | INTRAVENOUS | Status: AC
  Administered 2013-01-06: 8 mg via INTRAVENOUS

## 2013-01-06 MED ORDER — HYDROCODONE-ACETAMINOPHEN 5-325 MG PO TABS: 0.5000 | ORAL_TABLET | ORAL | Status: DC | PRN

## 2013-01-06 MED ORDER — ONDANSETRON 8 MG/NS 50 ML IVPB
INTRAVENOUS | Status: AC
  Filled 2013-01-06: qty 8

## 2013-01-06 MED ORDER — SODIUM CHLORIDE 0.9 % IV SOLN
1000.0000 mL | Freq: Once | INTRAVENOUS | Status: AC
  Administered 2013-01-06: 1000 mL via INTRAVENOUS

## 2013-01-06 NOTE — Telephone Encounter (Signed)
gv and printed appt sched and avs for pt for DEC...gv pt barium °

## 2013-01-06 NOTE — Progress Notes (Signed)
   [facility]    OFFICE PROGRESS NOTE   INTERVAL HISTORY:   He returns for scheduled followup of esophagus cancer. He completed radiation yesterday. He complains of odynophagia. This has limited his ability to eat and drink. He felt better after receiving IV fluids on 01/03/2013. He feels "weak ".  Objective:  Vital signs in last 24 hours:  Blood pressure 105/60, pulse 82, temperature 97.7 F (36.5 C), temperature source Oral, resp. rate 17, height 6' (1.829 m), weight 188 lb 11.2 oz (85.594 kg), SpO2 95.00%.    HEENT: No thrush or ulcers Resp: Lungs clear bilaterally Cardio: Irregular GI: No hepatosplenomegaly, nontender, no mass Vascular: No leg edema, the skin turgor is diminished Neuro: Alert and oriented  Skin: No rash     Lab Results:  Lab Results  Component Value Date   WBC 3.1* 12/20/2012   HGB 12.4* 12/20/2012   HCT 36.9* 12/20/2012   MCV 85.6 12/20/2012   PLT 124* 12/20/2012   ANC 1.5  BUN 21.7, creatinine 1.6, potassium 4.8  PT/INR on 01/03/2013-2.7  Medications: I have reviewed the patient's current medications.  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  Initiation of radiation and weekly Taxol/carboplatin 12/06/2012. Radiation completed 01/05/2013 2. Indeterminate right hepatic mass on ultrasound-10/20/2012/MRI-10/29/2012 , hypermetabolic on the PET scan 12/02/2012. 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  3. Dysphagia/subxiphoid pain secondary to #1. Improved.  4. History of atrial fibrillation-maintained on Coumadin.  5. Renal insufficiency.  6. History of a CVA.  7. 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  8. Diabetes.    9. odynophagia, limiting oral intake-he will receive IV fluids today.    Disposition:  Luis Mclean completed an abbreviated course of chemotherapy and radiation for treatment of dysphagia in the setting of metastatic esophagus cancer. His case was presented at the GI tumor conference on 01/05/2013. He appears to have an isolated liver metastasis. The consensus recommendation is to not pursue resection or other local treatment of the liver lesion.  Luis Mclean currently has a poor performance status secondary to recent treatment and limited oral intake. He is not a candidate for chemotherapy in his current condition. The plan is to complete a restaging CT of the abdomen prior to an office visit in approximately 3 weeks. We will then discussed observation versus systemic chemotherapy.  He will receive intravenous fluids today. He declines fluids beyond today and does not wish to return to the Cancer Center until his appointment in 3 weeks. His family will contact us if the oral intake does not improve. He was given a prescription for hydrocodone to use as needed for pain. I suspect the odynophagia will improve over the next week.  He will discontinue his blood pressure medication for now.   Thornton Papas, MD  01/06/2013  1:23 PM

## 2013-01-06 NOTE — Patient Instructions (Signed)
Dehydration, Adult  Dehydration means your body does not have as much fluid as it needs. Your kidneys, brain, and heart will not work properly without the right amount of fluids and salt.   HOME CARE   Ask your doctor how to replace body fluid losses (rehydrate).   Drink enough fluids to keep your pee (urine) clear or pale yellow.   Drink small amounts of fluids often if you feel sick to your stomach (nauseous) or throw up (vomit).   Eat like you normally do.   Avoid:   Foods or drinks high in sugar.   Bubbly (carbonated) drinks.   Juice.   Very hot or cold fluids.   Drinks with caffeine.   Fatty, greasy foods.   Alcohol.   Tobacco.   Eating too much.   Gelatin desserts.   Wash your hands to avoid spreading germs (bacteria, viruses).   Only take medicine as told by your doctor.   Keep all doctor visits as told.  GET HELP RIGHT AWAY IF:    You cannot drink something without throwing up.   You get worse even with treatment.   Your vomit has blood in it or looks greenish.   Your poop (stool) has blood in it or looks black and tarry.   You have not peed in 6 to 8 hours.   You pee a small amount of very dark pee.   You have a fever.   You pass out (faint).   You have belly (abdominal) pain that gets worse or stays in one spot (localizes).   You have a rash, stiff neck, or bad headache.   You get easily annoyed, sleepy, or are hard to wake up.   You feel weak, dizzy, or very thirsty.  MAKE SURE YOU:    Understand these instructions.   Will watch your condition.   Will get help right away if you are not doing well or get worse.  Document Released: 12/07/2008 Document Revised: 05/05/2011 Document Reviewed: 09/30/2010  [employer] Patient Information 2014 [employer].

## 2013-01-06 NOTE — Telephone Encounter (Signed)
Made patient aware of INR 4.62-instructed to hold coumadin until after recheck on Monday, 11/17. He understands and agrees. POF to scheduler

## 2013-01-06 NOTE — Telephone Encounter (Signed)
Talked to wife gave her appt for lab only 11/17th

## 2013-01-06 NOTE — Progress Notes (Signed)
Wife bought in denial from [place]. I will forward to billing.

## 2013-01-07 ENCOUNTER — Ambulatory Visit: Payer: Medicare Other

## 2013-01-09 ENCOUNTER — Other Ambulatory Visit: Payer: Self-pay

## 2013-01-09 ENCOUNTER — Inpatient Hospital Stay (HOSPITAL_COMMUNITY)
Admission: EM | Admit: 2013-01-09 | Discharge: 2013-01-13 | DRG: 640 | Disposition: A | Discharge status: Skilled Nursing Facility | Payer: Medicare Other | Attending: Internal Medicine | Admitting: Internal Medicine

## 2013-01-09 ENCOUNTER — Inpatient Hospital Stay (HOSPITAL_COMMUNITY): Payer: Medicare Other

## 2013-01-09 ENCOUNTER — Emergency Department (HOSPITAL_COMMUNITY): Payer: Medicare Other

## 2013-01-09 ENCOUNTER — Encounter (HOSPITAL_COMMUNITY): Payer: Self-pay | Admitting: Emergency Medicine

## 2013-01-09 DIAGNOSIS — R627 Adult failure to thrive: Secondary | ICD-10-CM | POA: Diagnosis present

## 2013-01-09 DIAGNOSIS — E785 Hyperlipidemia, unspecified: Secondary | ICD-10-CM | POA: Diagnosis present

## 2013-01-09 DIAGNOSIS — I4891 Unspecified atrial fibrillation: Secondary | ICD-10-CM

## 2013-01-09 DIAGNOSIS — E860 Dehydration: Secondary | ICD-10-CM

## 2013-01-09 DIAGNOSIS — K5900 Constipation, unspecified: Secondary | ICD-10-CM | POA: Diagnosis present

## 2013-01-09 DIAGNOSIS — R6510 Systemic inflammatory response syndrome (SIRS) of non-infectious origin without acute organ dysfunction: Secondary | ICD-10-CM | POA: Diagnosis present

## 2013-01-09 DIAGNOSIS — Z87891 Personal history of nicotine dependence: Secondary | ICD-10-CM

## 2013-01-09 DIAGNOSIS — I129 Hypertensive chronic kidney disease with stage 1 through stage 4 chronic kidney disease, or unspecified chronic kidney disease: Secondary | ICD-10-CM | POA: Diagnosis present

## 2013-01-09 DIAGNOSIS — Z7901 Long term (current) use of anticoagulants: Secondary | ICD-10-CM

## 2013-01-09 DIAGNOSIS — C155 Malignant neoplasm of lower third of esophagus: Secondary | ICD-10-CM

## 2013-01-09 DIAGNOSIS — L723 Sebaceous cyst: Secondary | ICD-10-CM

## 2013-01-09 DIAGNOSIS — Z9221 Personal history of antineoplastic chemotherapy: Secondary | ICD-10-CM

## 2013-01-09 DIAGNOSIS — E079 Disorder of thyroid, unspecified: Secondary | ICD-10-CM | POA: Diagnosis present

## 2013-01-09 DIAGNOSIS — Z794 Long term (current) use of insulin: Secondary | ICD-10-CM

## 2013-01-09 DIAGNOSIS — G589 Mononeuropathy, unspecified: Secondary | ICD-10-CM | POA: Diagnosis present

## 2013-01-09 DIAGNOSIS — T451X5A Adverse effect of antineoplastic and immunosuppressive drugs, initial encounter: Secondary | ICD-10-CM

## 2013-01-09 DIAGNOSIS — M216X9 Other acquired deformities of unspecified foot: Secondary | ICD-10-CM | POA: Diagnosis present

## 2013-01-09 DIAGNOSIS — K208 Other esophagitis without bleeding: Secondary | ICD-10-CM | POA: Diagnosis present

## 2013-01-09 DIAGNOSIS — Z8673 Personal history of transient ischemic attack (TIA), and cerebral infarction without residual deficits: Secondary | ICD-10-CM

## 2013-01-09 DIAGNOSIS — E861 Hypovolemia: Secondary | ICD-10-CM | POA: Diagnosis present

## 2013-01-09 DIAGNOSIS — C787 Secondary malignant neoplasm of liver and intrahepatic bile duct: Secondary | ICD-10-CM | POA: Diagnosis present

## 2013-01-09 DIAGNOSIS — Z923 Personal history of irradiation: Secondary | ICD-10-CM

## 2013-01-09 DIAGNOSIS — T66XXXS Radiation sickness, unspecified, sequela: Secondary | ICD-10-CM

## 2013-01-09 DIAGNOSIS — N183 Chronic kidney disease, stage 3 unspecified: Secondary | ICD-10-CM

## 2013-01-09 DIAGNOSIS — Y842 Radiological procedure and radiotherapy as the cause of abnormal reaction of the patient, or of later complication, without mention of misadventure at the time of the procedure: Secondary | ICD-10-CM | POA: Diagnosis present

## 2013-01-09 DIAGNOSIS — D61810 Antineoplastic chemotherapy induced pancytopenia: Secondary | ICD-10-CM | POA: Diagnosis present

## 2013-01-09 DIAGNOSIS — E871 Hypo-osmolality and hyponatremia: Secondary | ICD-10-CM

## 2013-01-09 DIAGNOSIS — K219 Gastro-esophageal reflux disease without esophagitis: Secondary | ICD-10-CM | POA: Diagnosis present

## 2013-01-09 DIAGNOSIS — E119 Type 2 diabetes mellitus without complications: Secondary | ICD-10-CM | POA: Diagnosis present

## 2013-01-09 DIAGNOSIS — Z79899 Other long term (current) drug therapy: Secondary | ICD-10-CM

## 2013-01-09 DIAGNOSIS — C159 Malignant neoplasm of esophagus, unspecified: Secondary | ICD-10-CM

## 2013-01-09 LAB — COMPREHENSIVE METABOLIC PANEL
ALT: 10 U/L (ref 0–53)
AST: 28 U/L (ref 0–37)
Alkaline Phosphatase: 58 U/L (ref 39–117)
CO2: 23 mEq/L (ref 19–32)
Calcium: 8.4 mg/dL (ref 8.4–10.5)
Creatinine, Ser: 1.06 mg/dL (ref 0.50–1.35)
GFR calc non Af Amer: 64 mL/min — ABNORMAL LOW (ref >90)
Potassium: 5 mEq/L (ref 3.5–5.1)
Sodium: 125 mEq/L — ABNORMAL LOW (ref 135–145)
Total Protein: 5.5 g/dL — ABNORMAL LOW (ref 6.0–8.3)

## 2013-01-09 LAB — CBC WITH DIFFERENTIAL/PLATELET
Basophils Absolute: 0 10*3/uL (ref 0.0–0.1)
Eosinophils Absolute: 0 10*3/uL (ref 0.0–0.7)
Eosinophils Relative: 0 % (ref 0–5)
HCT: 33.1 % — ABNORMAL LOW (ref 39.0–52.0)
Lymphocytes Relative: 9 % — ABNORMAL LOW (ref 12–46)
Lymphs Abs: 0.4 10*3/uL — ABNORMAL LOW (ref 0.7–4.0)
MCV: 83.8 fL (ref 78.0–100.0)
Monocytes Relative: 8 % (ref 3–12)
Neutro Abs: 4 10*3/uL (ref 1.7–7.7)
Neutrophils Relative %: 83 % — ABNORMAL HIGH (ref 43–77)
Platelets: 147 10*3/uL — ABNORMAL LOW (ref 150–400)
RBC: 3.95 MIL/uL — ABNORMAL LOW (ref 4.22–5.81)
RDW: 14.8 % (ref 11.5–15.5)
WBC: 4.8 10*3/uL (ref 4.0–10.5)

## 2013-01-09 LAB — TROPONIN I
Troponin I: <0.3 ng/mL (ref <0.30)
Troponin I: <0.3 ng/mL (ref <0.30)

## 2013-01-09 LAB — URINALYSIS, ROUTINE W REFLEX MICROSCOPIC
Glucose, UA: 500 mg/dL — AB
Ketones, ur: 15 mg/dL — AB
Leukocytes, UA: NEGATIVE
Specific Gravity, Urine: 1.021 (ref 1.005–1.030)
pH: 7 (ref 5.0–8.0)

## 2013-01-09 LAB — GLUCOSE, CAPILLARY: Glucose-Capillary: 128 mg/dL — ABNORMAL HIGH (ref 70–99)

## 2013-01-09 LAB — HEMOGLOBIN A1C
Hgb A1c MFr Bld: 9.6 % — ABNORMAL HIGH (ref <5.7)
Mean Plasma Glucose: 229 mg/dL — ABNORMAL HIGH (ref <117)

## 2013-01-09 MED ORDER — SUCRALFATE 1 G PO TABS
1.0000 g | ORAL_TABLET | Freq: Three times a day (TID) | ORAL | Status: DC
  Administered 2013-01-09 – 2013-01-12 (×13): 1 g via ORAL
  Filled 2013-01-09 (×19): qty 1

## 2013-01-09 MED ORDER — ONDANSETRON HCL 4 MG/2ML IJ SOLN
4.0000 mg | Freq: Four times a day (QID) | INTRAMUSCULAR | Status: DC | PRN
  Filled 2013-01-09: qty 2

## 2013-01-09 MED ORDER — SODIUM CHLORIDE 0.9 % IV SOLN
Freq: Once | INTRAVENOUS | Status: AC
  Administered 2013-01-09: 11:00:00 via INTRAVENOUS

## 2013-01-09 MED ORDER — METOCLOPRAMIDE HCL 5 MG/ML IJ SOLN
5.0000 mg | Freq: Once | INTRAMUSCULAR | Status: AC
  Administered 2013-01-09: 5 mg via INTRAVENOUS
  Filled 2013-01-09: qty 2

## 2013-01-09 MED ORDER — LEVOTHYROXINE SODIUM 112 MCG PO TABS
112.0000 ug | ORAL_TABLET | Freq: Every day | ORAL | Status: DC
  Administered 2013-01-10 – 2013-01-13 (×4): 112 ug via ORAL
  Filled 2013-01-09 (×6): qty 1

## 2013-01-09 MED ORDER — ONDANSETRON HCL 4 MG/2ML IJ SOLN
4.0000 mg | Freq: Once | INTRAMUSCULAR | Status: AC
  Administered 2013-01-09: 4 mg via INTRAVENOUS
  Filled 2013-01-09: qty 2

## 2013-01-09 MED ORDER — SODIUM CHLORIDE 0.9 % IV SOLN
INTRAVENOUS | Status: DC
  Administered 2013-01-09 – 2013-01-10 (×2): via INTRAVENOUS
  Administered 2013-01-10: 18:00:00 100 mL/h via INTRAVENOUS
  Administered 2013-01-11 – 2013-01-12 (×3): via INTRAVENOUS

## 2013-01-09 MED ORDER — VANCOMYCIN HCL IN DEXTROSE 750-5 MG/150ML-% IV SOLN
750.0000 mg | Freq: Two times a day (BID) | INTRAVENOUS | Status: DC
  Administered 2013-01-09 – 2013-01-11 (×4): 750 mg via INTRAVENOUS
  Filled 2013-01-09 (×6): qty 150

## 2013-01-09 MED ORDER — SODIUM CHLORIDE 0.9 % IJ SOLN
3.0000 mL | Freq: Two times a day (BID) | INTRAMUSCULAR | Status: DC
  Administered 2013-01-11 – 2013-01-13 (×3): 3 mL via INTRAVENOUS

## 2013-01-09 MED ORDER — ONDANSETRON HCL 4 MG/2ML IJ SOLN
4.0000 mg | Freq: Four times a day (QID) | INTRAMUSCULAR | Status: DC
  Administered 2013-01-09 – 2013-01-13 (×15): 4 mg via INTRAVENOUS
  Filled 2013-01-09 (×15): qty 2

## 2013-01-09 MED ORDER — MORPHINE SULFATE 4 MG/ML IJ SOLN
4.0000 mg | INTRAMUSCULAR | Status: DC | PRN
  Administered 2013-01-10 – 2013-01-12 (×4): 4 mg via INTRAVENOUS
  Filled 2013-01-09 (×4): qty 1

## 2013-01-09 MED ORDER — ACETAMINOPHEN 650 MG RE SUPP
650.0000 mg | Freq: Four times a day (QID) | RECTAL | Status: DC | PRN
  Administered 2013-01-09: 19:00:00 650 mg via RECTAL
  Filled 2013-01-09: qty 1

## 2013-01-09 MED ORDER — SENNA 8.6 MG PO TABS
1.0000 | ORAL_TABLET | Freq: Two times a day (BID) | ORAL | Status: DC
Start: 2013-01-09 — End: 2013-01-13
  Administered 2013-01-09 – 2013-01-12 (×6): 8.6 mg via ORAL
  Filled 2013-01-09 (×6): qty 1

## 2013-01-09 MED ORDER — INSULIN ASPART 100 UNIT/ML ~~LOC~~ SOLN
0.0000 [IU] | Freq: Three times a day (TID) | SUBCUTANEOUS | Status: DC
  Administered 2013-01-09 – 2013-01-10 (×2): 1 [IU] via SUBCUTANEOUS
  Administered 2013-01-11: 5 [IU] via SUBCUTANEOUS
  Administered 2013-01-11: 3 [IU] via SUBCUTANEOUS
  Administered 2013-01-12: 2 [IU] via SUBCUTANEOUS
  Administered 2013-01-12: 3 [IU] via SUBCUTANEOUS
  Administered 2013-01-12: 1 [IU] via SUBCUTANEOUS
  Administered 2013-01-13: 2 [IU] via SUBCUTANEOUS
  Administered 2013-01-13: 1 [IU] via SUBCUTANEOUS

## 2013-01-09 MED ORDER — ALPRAZOLAM 0.25 MG PO TABS: 0.2500 mg | ORAL_TABLET | Freq: Every evening | ORAL | Status: DC | PRN

## 2013-01-09 MED ORDER — PIPERACILLIN-TAZOBACTAM 3.375 G IVPB 30 MIN: 3.3750 g | Freq: Three times a day (TID) | INTRAVENOUS | Status: DC

## 2013-01-09 MED ORDER — PROCHLORPERAZINE EDISYLATE 5 MG/ML IJ SOLN
5.0000 mg | Freq: Four times a day (QID) | INTRAMUSCULAR | Status: DC | PRN
  Administered 2013-01-09: 18:00:00 5 mg via INTRAVENOUS
  Filled 2013-01-09: qty 2

## 2013-01-09 MED ORDER — WARFARIN SODIUM 5 MG PO TABS
5.0000 mg | ORAL_TABLET | Freq: Once | ORAL | Status: AC
  Administered 2013-01-09: 5 mg via ORAL
  Filled 2013-01-09 (×2): qty 1

## 2013-01-09 MED ORDER — PIPERACILLIN-TAZOBACTAM 3.375 G IVPB
3.3750 g | Freq: Three times a day (TID) | INTRAVENOUS | Status: DC
  Administered 2013-01-09 – 2013-01-12 (×8): 3.375 g via INTRAVENOUS
  Filled 2013-01-09 (×9): qty 50

## 2013-01-09 MED ORDER — SIMVASTATIN 20 MG PO TABS
20.0000 mg | ORAL_TABLET | Freq: Every evening | ORAL | Status: DC
  Administered 2013-01-10 – 2013-01-12 (×3): 20 mg via ORAL
  Filled 2013-01-09 (×5): qty 1

## 2013-01-09 MED ORDER — PANTOPRAZOLE SODIUM 40 MG PO TBEC
40.0000 mg | DELAYED_RELEASE_TABLET | Freq: Every day | ORAL | Status: DC
  Administered 2013-01-10 – 2013-01-12 (×3): 40 mg via ORAL
  Filled 2013-01-09 (×5): qty 1

## 2013-01-09 MED ORDER — DOCUSATE SODIUM 100 MG PO CAPS
100.0000 mg | ORAL_CAPSULE | Freq: Two times a day (BID) | ORAL | Status: DC
  Administered 2013-01-09 – 2013-01-12 (×6): 100 mg via ORAL
  Filled 2013-01-09 (×9): qty 1

## 2013-01-09 MED ORDER — LIDOCAINE-PRILOCAINE 2.5-2.5 % EX CREA: TOPICAL_CREAM | Freq: Once | CUTANEOUS | Status: DC

## 2013-01-09 MED ORDER — INSULIN GLARGINE 100 UNIT/ML ~~LOC~~ SOLN
10.0000 [IU] | Freq: Every day | SUBCUTANEOUS | Status: DC
  Administered 2013-01-09 – 2013-01-10 (×2): 10 [IU] via SUBCUTANEOUS
  Filled 2013-01-09 (×3): qty 0.1

## 2013-01-09 MED ORDER — HYDROCODONE-ACETAMINOPHEN 5-325 MG PO TABS
1.0000 | ORAL_TABLET | ORAL | Status: DC | PRN
  Administered 2013-01-10 – 2013-01-13 (×5): 1 via ORAL
  Filled 2013-01-09 (×5): qty 1

## 2013-01-09 MED ORDER — MORPHINE SULFATE 4 MG/ML IJ SOLN
4.0000 mg | Freq: Once | INTRAMUSCULAR | Status: AC
  Administered 2013-01-09: 4 mg via INTRAVENOUS
  Filled 2013-01-09: qty 1

## 2013-01-09 MED ORDER — GABAPENTIN 300 MG PO CAPS
300.0000 mg | ORAL_CAPSULE | Freq: Every evening | ORAL | Status: DC
  Administered 2013-01-10 – 2013-01-12 (×3): 300 mg via ORAL
  Filled 2013-01-09 (×5): qty 1

## 2013-01-09 MED ORDER — WARFARIN - PHARMACIST DOSING INPATIENT: Freq: Every day | Status: DC

## 2013-01-09 MED ORDER — ACETAMINOPHEN 325 MG PO TABS
650.0000 mg | ORAL_TABLET | Freq: Four times a day (QID) | ORAL | Status: DC | PRN
  Filled 2013-01-09: qty 2

## 2013-01-09 NOTE — Progress Notes (Signed)
ANTIBIOTIC CONSULT NOTE - INITIAL  Pharmacy Consult for vancomycin Indication: rule out sepsis  No Known Allergies  Patient Measurements: Height: 6' (182.9 cm) Weight: 187 lb 6.3 oz (85 kg) IBW/kg (Calculated) : 77.6  Vital Signs: Temp: 101.6 F (38.7 C) (11/16 1751) Temp src: Oral (11/16 1751) BP: 132/66 mmHg (11/16 1751) Pulse Rate: 112 (11/16 1751) Intake/Output from previous day:   Intake/Output from this shift:    Labs:  Recent Labs  01/09/13 0840  WBC 4.8  HGB 11.7*  PLT 147*  CREATININE 1.06   Estimated Creatinine Clearance: 60 ml/min (by C-G formula based on Cr of 1.06). No results found for this basename: VANCOTROUGH, VANCOPEAK, VANCORANDOM, GENTTROUGH, GENTPEAK, GENTRANDOM, TOBRATROUGH, TOBRAPEAK, TOBRARND, AMIKACINPEAK, AMIKACINTROU, AMIKACIN,  in the last 72 hours   Microbiology: No results found for this or any previous visit (from the past 720 hour(s)).  Medical History: Past Medical History  Diagnosis Date  . Hyperlipidemia   . Hypertension   . Stroke   . GERD (gastroesophageal reflux disease)   . Cyst     back of neck  . Esophageal cancer 11/08/12    squmous cell and focal glandular diff  . Diabetes mellitus     type II  . A-fib   . Thyroid disease     Medications:  Scheduled:  . docusate sodium  100 mg Oral BID  . gabapentin  300 mg Oral QPM  . insulin aspart  0-9 Units Subcutaneous TID WC  . insulin glargine  10 Units Subcutaneous QHS  . levothyroxine  112 mcg Oral QAC breakfast  . metoCLOPramide (REGLAN) injection  5 mg Intravenous Once  . ondansetron (ZOFRAN) IV  4 mg Intravenous Q6H  . pantoprazole  40 mg Oral Daily  . piperacillin-tazobactam (ZOSYN)  IV  3.375 g Intravenous Q8H  . senna  1 tablet Oral BID  . simvastatin  20 mg Oral QPM  . sodium chloride  3 mL Intravenous Q12H  . sucralfate  1 g Oral TID AC & HS  . warfarin  5 mg Oral ONCE-1800  . Warfarin - Pharmacist Dosing Inpatient   Does not apply q1800   Infusions:   . sodium chloride 100 mL/hr at 01/09/13 1415   Assessment: 79 yo presented to ER with NV and fatigue with hx metastatic esophageal cancer to start broad spectrum abx for possible SIRS/sepsis.  Tmax 101.6  Normalized CrCl 55 ml/min/1.48m2   Goal of Therapy:  Vancomycin trough level 15-20 mcg/ml  Plan:  Vancomycin 750mg  q12   Hessie Knows, PharmD, BCPS Pager [phone] 01/09/2013 6:10 PM

## 2013-01-09 NOTE — ED Notes (Signed)
Pt finished last of 23 Radiation tx on Wed for esophageal and liver ca; pt having N/V , weakness, generalized pain; Per EMS pt is orthostatic, chronic a-fib and coumadin on hold due to elevated INR, treated for dehydration on Wed

## 2013-01-09 NOTE — H&P (Signed)
[facility] History and Physical  Luis Mclean:[id number] DOB: 1929-10-28 DOA: 01/09/2013   PCP: Tomma Lightning, MD   Chief Complaint: Nausea, vomiting, generalized fatigue  HPI:  79 year old male with a history of 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 The patient states that he has had odynophagia and increasing difficulty eating and drinking since the start of radiation therapy for his esophageal adenocarcinoma. He describes a burning in his chest which worsened in the past few days. Last evening, he experienced dry heaves. He checked his blood pressure with his automated machine at home. He states that his heart rate was in the 130s with systolic blood pressure in the 130s. Because of his decreased by mouth intake and worsening weakness, he came to the emergency department for further evaluation. His last radiation therapy was on 01/05/2013. His last chemotherapy was 2 weeks ago. He complained of some chest discomfort as well as shortness of breath last night. He states that his breathing has improved. He also had some dizziness without any syncope or falls. He denied any abdominal pain, dysuria, hematuria, headache, visual disturbance, hematochezia, melena, hemoptysis, coughing. His temperature at home was 100.73F.  In the emergency department, the patient was noted to have an initial heart rate in the 130s. He remained hemodynamically stable. The patient was started on intravenous fluids and given morphine and Zofran. BMP showed sodium 125, potassium 5.0. CBC showed WBC 4.8, hemoglobin 11.7, platelets 147,000. Troponin was negative. Lactic acid was 1.0. Urinalysis was negative for pyuria. EKG showed atrial fibrillation with nonspecific T-wave flattening. Chest x-ray was negative for any  infiltrates. Assessment/Plan: Dehydration -Secondary to the patient's poor po intake and vomiting  -IV fluids -LFTs and Lipase were normal  -Urinalysis did not show any pyuria  Hyponatremia -Secondary to volume depletion  -Continue IV fluids  -The patient has clinically hypovolemic  Nausea and vomiting  -Likely exacerbated by radiation esophagitis -Continue Carafate -Continue Protonix -Anti-emetics Atrial fibrillation with RVR -Secondary to volume depletion -Heart rate is gradually improving with hydration (100-115 during my eval) -I anticipate that it will continue to improve with IV fluids -The patient is not on any chronotropic medications at home -As the patient is hemodynamically stable I will not start the patient on any chronotropic meds at this time -TSH Diabetes mellitus type 2  -Patient has been taking Lantus 20 units daily at home  -Will give half home dose given the patient's poor by mouth intake  -Hemoglobin A1c  Metastatic esophageal adenocarcinoma  -The patient has been placed on Dr. Kalman Drape consult list  abdominal pain/constipation  -2 view abdominal x-ray  Chest pain  -Low suspicion of pulmonary embolus as the patient is ready on Coumadin  -INR 1.96  -Cycle troponins        Past Medical History  Diagnosis Date  . Hyperlipidemia   . Hypertension   . Stroke   . GERD (gastroesophageal reflux disease)   . Cyst     back of neck  . Esophageal cancer 11/08/12    squmous cell and focal glandular diff  . Diabetes mellitus     type II  . A-fib   . Thyroid disease    Past Surgical History  Procedure Laterality Date  . Cholecystectomy  1989  . Rectal fissure repair  1985  . Back surgery  1989  . Esophageal biopsy  11/08/12  squamous cell & focal glandular diff   Social History:  reports that he quit smoking about 15 years ago. His smoking use included Cigars. He has never used smokeless tobacco. He reports that he does not drink alcohol or use  illicit drugs.   Family History  Problem Relation Age of Onset  . Cancer Brother     colon  . Cancer Brother     bone  . Cancer Brother     kidney/mets to bone     No Known Allergies    Prior to Admission medications   Medication Sig Start Date End Date Taking? Authorizing Provider  ALPRAZolam (XANAX) 0.25 MG tablet Take 1 tablet (0.25 mg total) by mouth at bedtime as needed for sleep. 12/20/12  Yes Rana Snare, NP  diphenhydrAMINE (SOMINEX) 25 MG tablet Take 25 mg by mouth at bedtime as needed for sleep.   Yes Historical Provider, MD  gabapentin (NEURONTIN) 300 MG capsule Take 300 mg by mouth every evening.   Yes Historical Provider, MD  HYDROcodone-acetaminophen (NORCO/VICODIN) 5-325 MG per tablet Take 0.5-1 tablets by mouth every 4 (four) hours as needed for moderate pain. 01/06/13  Yes Ladene Artist, MD  insulin glargine (LANTUS) 100 UNIT/ML injection Inject 20-50 Units into the skin every morning.    Yes Historical Provider, MD  levothyroxine (SYNTHROID, LEVOTHROID) 112 MCG tablet Take 112 mcg by mouth daily before breakfast.   Yes Historical Provider, MD  lidocaine-prilocaine (EMLA) cream Apply topically as needed. Apply 1 teaspoon to [facility] site 1-2 hours prior to stick and cover with plastic wrap 11/24/12  Yes Ladene Artist, MD  omeprazole (PRILOSEC) 20 MG capsule Take 20 mg by mouth daily.   Yes Historical Provider, MD  prochlorperazine (COMPAZINE) 5 MG tablet Take 1 tablet (5 mg total) by mouth every 6 (six) hours as needed for nausea. 11/24/12  Yes Ladene Artist, MD  simvastatin (ZOCOR) 40 MG tablet Take 20 mg by mouth every evening.    Yes Historical Provider, MD  sucralfate (CARAFATE) 1 G tablet Take 1 tablet (1 g total) by mouth 4 (four) times daily. 12/24/12  Yes Jonna Coup, MD  warfarin (COUMADIN) 7.5 MG tablet Takes 7.5 mg 4 days a week and 5 mg on Monday Wednesday and friday 12/20/12   Rana Snare, NP    Review of Systems:  Constitutional:  No weight  loss, night sweats Head&Eyes: No headache.  No vision loss.  No eye pain or scotoma ENT:  No Difficulty swallowing,Tooth/dental problems,Sore throat,  No ear ache Cardio-vascular:  No chest pain, Orthopnea, PND, swelling in lower extremities, palpitations  GI:  No   diarrhea, loss of appetite, hematochezia, melena, heartburn, indigestion, Resp:  No shortness of breath with exertion or at rest. No cough. No coughing up of blood .No wheezing.No chest wall deformity  Skin:  no rash or lesions.  GU:  no dysuria, change in color of urine, no urgency or frequency. No flank pain.  Musculoskeletal:  No joint pain or swelling. No decreased range of motion. No back pain.  Psych:  No change in mood or affect.  Neurologic: No headache, no dysesthesia, no focal weakness, no vision loss. No syncope  Physical Exam: Filed Vitals:   01/09/13 0750 01/09/13 0759 01/09/13 0840  BP: 135/75 135/75 128/78  Pulse: 94 118 119  Temp: 99 F (37.2 C) 99 F (37.2 C) 99.4 F (37.4 C)  TempSrc: Oral Oral Oral  Resp: 22  16  Height: 6' (  1.829 m)    Weight: 85.276 kg (188 lb)    SpO2: 93% 94% 95%   General:  A&O x 3, NAD, nontoxic, pleasant/cooperative Head/Eye: No conjunctival hemorrhage, no icterus, [place]/AT, No nystagmus ENT:  No icterus,  No thrush, good dentition, no pharyngeal exudate Neck:  No masses, no lymphadenpathy, no bruits CV:  RRR, no rub, no gallop, no S3 Lung:  CTAB, good air movement, no wheeze, no rhonchi Abdomen: soft/mild epigastric discomfort without, +BS, nondistended, no peritoneal signs Ext: No cyanosis, No rashes, No petechiae, No lymphangitis, trace LE edema   Labs on Admission:  Basic Metabolic Panel:  Recent Labs Lab 01/03/13 1439 01/06/13 1349 01/09/13 0840  NA 127* 130* 125*  K 4.8 4.4 5.0  CL  --   --  94*  CO2 22 22 23   GLUCOSE 366* 303* 173*  BUN 21.7 20.9 15  CREATININE 1.6* 1.3 1.06  CALCIUM 9.2 8.9 8.4   Liver Function Tests:  Recent Labs Lab  01/03/13 1439 01/09/13 0840  AST 18 28  ALT 9 10  ALKPHOS 62 58  BILITOT 0.25 0.4  PROT 5.9* 5.5*  ALBUMIN 2.7* 2.4*    Recent Labs Lab 01/09/13 0840  LIPASE 15   No results found for this basename: AMMONIA,  in the last 168 hours CBC:  Recent Labs Lab 01/06/13 1349 01/09/13 0840  WBC 3.8* 4.8  NEUTROABS 2.8 4.0  HGB 11.6* 11.7*  HCT 34.4* 33.1*  MCV 84.9 83.8  PLT 135* 147*   Cardiac Enzymes:  Recent Labs Lab 01/09/13 0840  TROPONINI <0.30   BNP: No components found with this basename: POCBNP,  CBG: No results found for this basename: GLUCAP,  in the last 168 hours  Radiological Exams on Admission: Dg Chest 2 View  01/09/2013   CLINICAL DATA:  Abdominal pain, nausea, vomiting  EXAM: CHEST  2 VIEW  COMPARISON:  04/12/2010  FINDINGS: The heart size and mediastinal contours are within normal limits. Elevation of the right diaphragm. Both lungs are clear. The visualized skeletal structures are unremarkable.  IMPRESSION: No active cardiopulmonary disease.   Electronically Signed   By: Elige Ko   On: 01/09/2013 08:29    EKG: Independently reviewed. Atrial fibrillation, and nonspecific ST flattening    Time spent:70 minutes Code Status:   FULL Family Communication:   Son and wife at bedside   Tamra Koos, DO  [facility] Pager [phone]  If 7PM-7AM, please contact night-coverage [email or web address] Password [facility] 01/09/2013, 11:47 AM

## 2013-01-09 NOTE — ED Notes (Signed)
Bed: ZO10 Expected date:  Expected time:  Means of arrival:  Comments: ems- cancer pt, finished last radiation treatment on Wednesday, generalized pain

## 2013-01-09 NOTE — ED Provider Notes (Signed)
CSN: [id number]     Arrival date & time 01/09/13  7829 History   First MD Initiated Contact with Patient 01/09/13 [phone]     Chief Complaint  Patient presents with  . Weakness  . Nausea  . Emesis  . Abdominal Pain   (Consider location/radiation/quality/duration/timing/severity/associated sxs/prior Treatment) HPI Comments: Pt is a 79 y.o. male with Pmhx as above including metastatic esophageal cancer who presents with worsening generalized weakness, nausea, vomiting since this morning, and T 101 at home PTA.  Wife reports poor PO intake. He has had a constant, mid-sternal burning pain at site of radiation treatment that has been worse since finishing radiation this week.   Patient is a 79 y.o. male presenting with chest pain. The history is provided by the patient and the spouse. No language interpreter was used.  Chest Pain Pain location:  Substernal area Pain quality: burning   Pain radiates to:  Does not radiate Pain radiates to the back: no   Pain severity:  Moderate Onset quality:  Gradual Timing:  Constant Progression:  Worsening Chronicity:  Recurrent Context: at rest   Relieved by:  Nothing Worsened by:  Nothing tried Ineffective treatments:  None tried Associated symptoms: anorexia, anxiety, cough, fatigue, fever, nausea, shortness of breath and vomiting   Associated symptoms: no abdominal pain, no back pain, no dizziness, no dysphagia, no headache, no numbness and no weakness   Cough:    Cough characteristics:  Non-productive and productive   Sputum characteristics:  Clear   Severity:  Moderate   Onset quality:  Gradual   Timing:  Constant   Progression:  Worsening   Chronicity:  New Fatigue:    Severity:  Severe   Duration:  1 week   Timing:  Constant   Progression:  Worsening Fever:    Duration:  1 day   Timing:  Intermittent   Max temp PTA (F):  101   Temp source:  Oral   Progression:  Waxing and waning Nausea:    Severity:  Moderate   Onset quality:   Gradual   Duration:  1 week   Timing:  Intermittent   Progression:  Waxing and waning Shortness of breath:    Severity:  Moderate   Onset quality:  Sudden   Duration: mins, last night, now resolved.   Timing:  Sporadic   Progression:  Resolved Vomiting:    Quality:  Stomach contents   Severity:  Mild   Duration:  1 day   Timing:  Intermittent   Progression:  Unchanged Risk factors: high cholesterol, hypertension and male sex   Risk factors: no coronary artery disease, no prior DVT/PE and no smoking   Risk factors comment:  Esophageal cancer   Past Medical History  Diagnosis Date  . Hyperlipidemia   . Hypertension   . Stroke   . GERD (gastroesophageal reflux disease)   . Cyst     back of neck  . Esophageal cancer 11/08/12    squmous cell and focal glandular diff  . Diabetes mellitus     type II  . A-fib   . Thyroid disease    Past Surgical History  Procedure Laterality Date  . Cholecystectomy  1989  . Rectal fissure repair  1985  . Back surgery  1989  . Esophageal biopsy  11/08/12    squamous cell & focal glandular diff   Family History  Problem Relation Age of Onset  . Cancer Brother     colon  . Cancer Brother  bone  . Cancer Brother     kidney/mets to bone   History  Substance Use Topics  . Smoking status: Former Smoker -- 30 years    Types: Cigars    Quit date: 05/05/1997  . Smokeless tobacco: Former Neurosurgeon  . Alcohol Use: No    Review of Systems  Constitutional: Positive for fever and fatigue. Negative for activity change and appetite change.  HENT: Negative for congestion, facial swelling, rhinorrhea and trouble swallowing.   Eyes: Negative for photophobia and pain.  Respiratory: Positive for cough and shortness of breath. Negative for chest tightness.   Cardiovascular: Positive for chest pain. Negative for leg swelling.  Gastrointestinal: Positive for nausea, vomiting and anorexia. Negative for abdominal pain, diarrhea and constipation.    Endocrine: Negative for polydipsia and polyuria.  Genitourinary: Negative for dysuria, urgency, decreased urine volume and difficulty urinating.  Musculoskeletal: Negative for back pain and gait problem.  Skin: Negative for color change, rash and wound.  Allergic/Immunologic: Negative for immunocompromised state.  Neurological: Negative for dizziness, facial asymmetry, speech difficulty, weakness, numbness and headaches.  Psychiatric/Behavioral: Negative for confusion, decreased concentration and agitation.    Allergies  Review of patient's allergies indicates no known allergies.  Home Medications   No current outpatient prescriptions on file. BP 101/65  Pulse 92  Temp(Src) 98 F (36.7 C) (Oral)  Resp 18  Ht 6' (1.829 m)  Wt 187 lb 6.3 oz (85 kg)  BMI 25.41 kg/m2  SpO2 96% Physical Exam  Constitutional: He is oriented to person, place, and time. He appears well-developed and well-nourished. No distress.  Chronically ill appearing   HENT:  Head: Normocephalic and atraumatic.  Mouth/Throat: No oropharyngeal exudate.  Eyes: Pupils are equal, round, and reactive to light.  Neck: Normal range of motion. Neck supple.  Cardiovascular: Normal heart sounds.  An irregularly irregular rhythm present. Tachycardia present.  Exam reveals no gallop and no friction rub.   No murmur heard. Pulmonary/Chest: Effort normal and breath sounds normal. No respiratory distress. He has no wheezes. He has no rales.    Abdominal: Soft. Bowel sounds are normal. He exhibits no distension and no mass. There is no tenderness. There is no rebound and no guarding.  Musculoskeletal: Normal range of motion. He exhibits no edema and no tenderness.  Neurological: He is alert and oriented to person, place, and time.  Skin: Skin is warm and dry.  Psychiatric: He has a normal mood and affect.    ED Course  Procedures (including critical care time) Labs Review Labs Reviewed  CBC WITH DIFFERENTIAL -  Abnormal; Notable for the following:    RBC 3.95 (*)    Hemoglobin 11.7 (*)    HCT 33.1 (*)    Platelets 147 (*)    Neutrophils Relative % 83 (*)    Lymphocytes Relative 9 (*)    Lymphs Abs 0.4 (*)    All other components within normal limits  COMPREHENSIVE METABOLIC PANEL - Abnormal; Notable for the following:    Sodium 125 (*)    Chloride 94 (*)    Glucose, Bld 173 (*)    Total Protein 5.5 (*)    Albumin 2.4 (*)    GFR calc non Af Amer 64 (*)    GFR calc Af Amer 74 (*)    All other components within normal limits  URINALYSIS, ROUTINE W REFLEX MICROSCOPIC - Abnormal; Notable for the following:    Glucose, UA 500 (*)    Hgb urine dipstick TRACE (*)  Ketones, ur 15 (*)    Protein, ur 100 (*)    All other components within normal limits  PROTIME-INR - Abnormal; Notable for the following:    Prothrombin Time 21.7 (*)    INR 1.96 (*)    All other components within normal limits  HEMOGLOBIN A1C - Abnormal; Notable for the following:    Hemoglobin A1C 9.6 (*)    Mean Plasma Glucose 229 (*)    All other components within normal limits  TSH - Abnormal; Notable for the following:    TSH 7.261 (*)    All other components within normal limits  BASIC METABOLIC PANEL - Abnormal; Notable for the following:    Sodium 129 (*)    Creatinine, Ser 1.48 (*)    GFR calc non Af Amer 43 (*)    GFR calc Af Amer 49 (*)    All other components within normal limits  CBC - Abnormal; Notable for the following:    RBC 3.55 (*)    Hemoglobin 10.4 (*)    HCT 30.0 (*)    Platelets 127 (*)    All other components within normal limits  PROTIME-INR - Abnormal; Notable for the following:    Prothrombin Time 21.7 (*)    INR 1.96 (*)    All other components within normal limits  GLUCOSE, CAPILLARY - Abnormal; Notable for the following:    Glucose-Capillary 128 (*)    All other components within normal limits  GLUCOSE, CAPILLARY - Abnormal; Notable for the following:    Glucose-Capillary 136 (*)     All other components within normal limits  URINE CULTURE  CULTURE, BLOOD (ROUTINE X 2)  CULTURE, BLOOD (ROUTINE X 2)  LACTIC ACID, PLASMA  LIPASE, BLOOD  TROPONIN I  URINE MICROSCOPIC-ADD ON  TROPONIN I  TROPONIN I  GLUCOSE, CAPILLARY   Imaging Review Dg Chest 2 View  01/09/2013   CLINICAL DATA:  Abdominal pain, nausea, vomiting  EXAM: CHEST  2 VIEW  COMPARISON:  04/12/2010  FINDINGS: The heart size and mediastinal contours are within normal limits. Elevation of the right diaphragm. Both lungs are clear. The visualized skeletal structures are unremarkable.  IMPRESSION: No active cardiopulmonary disease.   Electronically Signed   By: Elige Ko   On: 01/09/2013 08:29   Dg Abd 2 Views  01/09/2013   CLINICAL DATA:  Constipation and abdominal pain.  EXAM: ABDOMEN - 2 VIEW  COMPARISON:  None.  FINDINGS: Mild increased stool burden is noted in the colon. No obstruction. No free air.  There has been previous cholecystectomy. Some residual barium is noted in sigmoid colon diverticula. Intervertebral cages fusing L3-L4 level.  IMPRESSION: Mild increased colonic stool burden.  No obstruction.  No free air.   Electronically Signed   By: Amie Portland M.D.   On: 01/09/2013 12:43    EKG Interpretation     Ventricular Rate:    PR Interval:    QRS Duration:   QT Interval:    QTC Calculation:   R Axis:     Text Interpretation:             Date: 01/09/2013  Rate: 115  Rhythm: atrial fibrillation  QRS Axis: normal  Intervals: no PR interval  ST/T Wave abnormalities: nonspecific T wave changes  Conduction Disutrbances:none  Narrative Interpretation:   Old EKG Reviewed: unchanged    MDM   1. Atrial fibrillation with RVR   2. Dehydration   3. Esophageal adenocarcinoma   4. Hyponatremia  Pt is a 79 y.o. male with Pmhx as above including metastatic esophageal cancer with low esophageal mass who presents with worsening generalized weakness, nausea, vomiting since this morning,  and T 101 at home PTA.  Wife reports poor PO intake. He has had a constant, mid-sternal burning pain at site of radiation treatment that has been worse since finishing radiation this week.  On PE, pt in afib, mildly tachycardic, dry mouth. Abdominal exam is benign. No LE pain/edema. W/U here shows hyponatremia, hypochloremia, stable Cr, Hb, suptheraputic INR, nml trop, nml lipase & LA.  I doubt ACS given constant pain, no new EKG findings & no trop elevation.  I feel symptoms likely related to cancer and radiation treatment. As pt poorly tolerating PO w/ metabolic derangement, [facility] consulted & will admit pt. Pt may also need to be restarted on rate-control agent for afib.         Shanna Cisco, MD 01/10/13 1125

## 2013-01-09 NOTE — Progress Notes (Signed)
Called by RN to notify me of persistent nausea and HR 110-120 with continued fluids.  VS-- 101.40F--HR112--RR22--132/66--94%RA Will schedule zofran around the clock and add prn compazine.  Give single dose of reglan.  Order echocardiogram, blood cultures.  Start empiric antibiotics  DTAT

## 2013-01-09 NOTE — Progress Notes (Signed)
ANTICOAGULATION CONSULT NOTE - Initial Consult  Pharmacy Consult for warfarin Indication: atrial fibrillation  No Known Allergies  Patient Measurements: Height: 6' (182.9 cm) Weight: 187 lb 6.3 oz (85 kg) IBW/kg (Calculated) : 77.6  Vital Signs: Temp: 98.5 F (36.9 C) (11/16 1245) Temp src: Oral (11/16 1245) BP: 145/89 mmHg (11/16 1245) Pulse Rate: 106 (11/16 1245)  Labs:  Recent Labs  01/09/13 0840  HGB 11.7*  HCT 33.1*  PLT 147*  LABPROT 21.7*  INR 1.96*  CREATININE 1.06  TROPONINI <0.30    Estimated Creatinine Clearance: 60 ml/min (by C-G formula based on Cr of 1.06).   Medical History: Past Medical History  Diagnosis Date  . Hyperlipidemia   . Hypertension   . Stroke   . GERD (gastroesophageal reflux disease)   . Cyst     back of neck  . Esophageal cancer 11/08/12    squmous cell and focal glandular diff  . Diabetes mellitus     type II  . A-fib   . Thyroid disease     Medications:  Scheduled:  . docusate sodium  100 mg Oral BID  . gabapentin  300 mg Oral QPM  . insulin aspart  0-9 Units Subcutaneous TID WC  . insulin glargine  10 Units Subcutaneous QHS  . levothyroxine  112 mcg Oral QAC breakfast  . pantoprazole  40 mg Oral Daily  . senna  1 tablet Oral BID  . simvastatin  20 mg Oral QPM  . sodium chloride  3 mL Intravenous Q12H  . sucralfate  1 g Oral TID AC & HS   Infusions:  . sodium chloride      Assessment: 79 yo male with metastatic esophageal cancer presented to ER with CC nausea, vomiting and fatigue. Pharmacy ordered to continue warfarin dosing for patient's history of afib. Note that patient reportedly takes 7.5mg  daily except 5mg  MonWedFri with last dose 11/13 and patient was instructed to hold doses because of an INR of 4.62 until INR rechecked on Monday 11/17.   INR just below goal upon admission - 1.96  Plts low - 147K  No reported bleeding  Goal of Therapy:  INR 2-3   Plan:  1) Warfarin 5mg  today 2) Daily  INR   Hessie Knows, PharmD, BCPS Pager [phone] 01/09/2013 2:23 PM

## 2013-01-10 ENCOUNTER — Other Ambulatory Visit: Payer: Medicare Other

## 2013-01-10 ENCOUNTER — Ambulatory Visit: Payer: Medicare Other

## 2013-01-10 DIAGNOSIS — C787 Secondary malignant neoplasm of liver and intrahepatic bile duct: Secondary | ICD-10-CM

## 2013-01-10 DIAGNOSIS — I4891 Unspecified atrial fibrillation: Secondary | ICD-10-CM

## 2013-01-10 DIAGNOSIS — C155 Malignant neoplasm of lower third of esophagus: Secondary | ICD-10-CM

## 2013-01-10 DIAGNOSIS — R509 Fever, unspecified: Secondary | ICD-10-CM

## 2013-01-10 DIAGNOSIS — E119 Type 2 diabetes mellitus without complications: Secondary | ICD-10-CM

## 2013-01-10 DIAGNOSIS — R1310 Dysphagia, unspecified: Secondary | ICD-10-CM

## 2013-01-10 DIAGNOSIS — N289 Disorder of kidney and ureter, unspecified: Secondary | ICD-10-CM

## 2013-01-10 LAB — BASIC METABOLIC PANEL
BUN: 20 mg/dL (ref 6–23)
CO2: 25 mEq/L (ref 19–32)
Calcium: 8.5 mg/dL (ref 8.4–10.5)
Creatinine, Ser: 1.48 mg/dL — ABNORMAL HIGH (ref 0.50–1.35)
GFR calc Af Amer: 49 mL/min — ABNORMAL LOW (ref >90)
Potassium: 4.2 mEq/L (ref 3.5–5.1)

## 2013-01-10 LAB — CBC
MCHC: 34.7 g/dL (ref 30.0–36.0)
MCV: 84.5 fL (ref 78.0–100.0)
Platelets: 127 10*3/uL — ABNORMAL LOW (ref 150–400)
RDW: 15.1 % (ref 11.5–15.5)
WBC: 4.3 10*3/uL (ref 4.0–10.5)

## 2013-01-10 LAB — URINE CULTURE

## 2013-01-10 LAB — GLUCOSE, CAPILLARY
Glucose-Capillary: 84 mg/dL (ref 70–99)
Glucose-Capillary: 199 mg/dL — ABNORMAL HIGH (ref 70–99)

## 2013-01-10 MED ORDER — BOOST PLUS PO LIQD
237.0000 mL | ORAL | Status: DC
  Administered 2013-01-11: 13:00:00 237 mL via ORAL
  Filled 2013-01-10 (×2): qty 237

## 2013-01-10 MED ORDER — WARFARIN SODIUM 5 MG PO TABS
5.0000 mg | ORAL_TABLET | Freq: Once | ORAL | Status: AC
  Administered 2013-01-10: 5 mg via ORAL
  Filled 2013-01-10: qty 1

## 2013-01-10 MED ORDER — ENSURE PUDDING PO PUDG
1.0000 | ORAL | Status: DC
  Administered 2013-01-11 – 2013-01-12 (×2): 1 via ORAL
  Filled 2013-01-10 (×3): qty 1

## 2013-01-10 MED ORDER — ADULT MULTIVITAMIN W/MINERALS CH
1.0000 | ORAL_TABLET | Freq: Every day | ORAL | Status: DC
  Administered 2013-01-11 – 2013-01-13 (×3): 1 via ORAL
  Filled 2013-01-10 (×5): qty 1

## 2013-01-10 NOTE — Progress Notes (Signed)
[facility] PROGRESS NOTE  Luis Mclean WJX:[id number] DOB: Jul 16, 1929 DOA: 01/09/2013 PCP: Tomma Lightning, MD  Assessment/Plan: Dehydration  -Secondary to the patient's poor po intake and vomiting  -IV fluids  -LFTs and Lipase were normal  -Urinalysis did not show any pyuria   Question of SIRS - started on empiric vancomycin  - rule out sepsis - fever defervesced - blood cultures pending  Hyponatremia  -Likely secondary to profound volume depletion  -Continue IV fluids  -The patient has clinically hypovolemic - Sodium improved today   Nausea and vomiting  -Likely exacerbated by radiation esophagitis  -Continue Carafate  -Continue Protonix  -Anti-emetics  - symptoms improving - advance to full liquids  Atrial fibrillation with RVR  -likely exacerbated by volume depletion  -Heart rate is improving with hydration -The patient is not on any chronotropic medications at home  -TSH just slightly elevated, no adjustments to meds at this time given acute illness  Diabetes mellitus type 2  -Patient has been taking Lantus 20 units daily at home  -Will give half home dose given the patient's poor by mouth intake  -Hemoglobin A1c 9.6% indicating poor glycemic control -Continue to monitor BS closely  Metastatic esophageal adenocarcinoma  -The patient has been placed on Dr. Kalman Drape consult list  - abdominal pain/constipation  -2 view abdominal x-ray c/w large stool burden in colon -providing laxatives  Chest pain  -Low suspicion of pulmonary embolus as the patient is ready on Coumadin  -INR 1.96  -Cycled troponins negative for acute myocardial injury  Code Status: Full Family Communication: wife at bedside  Disposition Plan: pending clinical course  HPI/Subjective: Pt reports that his esophageal discomfort is improving with morphine.  He would like to advance diet.  Denies CP and SOB.    Objective: Filed Vitals:   01/10/13 0554  BP: 98/57  Pulse: 98   Temp: 98.3 F (36.8 C)  Resp: 20    Intake/Output Summary (Last 24 hours) at 01/10/13 0956 Last data filed at 01/10/13 0830  Gross per 24 hour  Intake 1561.67 ml  Output    650 ml  Net 911.67 ml   Filed Weights   01/09/13 0750 01/09/13 1245  Weight: 188 lb (85.276 kg) 187 lb 6.3 oz (85 kg)    Exam:  General: A&O x 3, NAD, nontoxic, pleasant/cooperative  Head/Eye: No conjunctival hemorrhage, no icterus, [place]/AT, No nystagmus  ENT: No icterus, No thrush, good dentition, no pharyngeal exudate, dry MM  Neck: No masses, no lymphadenpathy, no bruits  CV: tachycardic, no murmur heard  Lung: CTAB, good air movement, no wheeze, no rhonchi  Abdomen: soft/mild epigastric discomfort without, +BS, nondistended, no peritoneal signs  Ext: No cyanosis, No rashes, No petechiae, No lymphangitis, trace LE edema  Data Reviewed: Basic Metabolic Panel:  Recent Labs Lab 01/09/13 0840 01/10/13 0415  NA 125* 129*  K 5.0 4.2  CL 94* 98  CO2 23 25  GLUCOSE 173* 91  BUN 15 20  CREATININE 1.06 1.48*  CALCIUM 8.4 8.5   Liver Function Tests:  Recent Labs Lab 01/03/13 1439 01/09/13 0840  AST 18 28  ALT 9 10  ALKPHOS 62 58  BILITOT 0.25 0.4  PROT 5.9* 5.5*  ALBUMIN 2.7* 2.4*    Recent Labs Lab 01/09/13 0840  LIPASE 15   No results found for this basename: AMMONIA,  in the last 168 hours CBC:  Recent Labs Lab 01/06/13 1349 01/09/13 0840 01/10/13 0415  WBC 3.8* 4.8 4.3  NEUTROABS 2.8 4.0  --  HGB 11.6* 11.7* 10.4*  HCT 34.4* 33.1* 30.0*  MCV 84.9 83.8 84.5  PLT 135* 147* 127*   Cardiac Enzymes:  Recent Labs Lab 01/09/13 0840 01/09/13 1500 01/09/13 2026  TROPONINI <0.30 <0.30 <0.30   BNP (last 3 results) No results found for this basename: PROBNP,  in the last 8760 hours CBG:  Recent Labs Lab 01/09/13 1711 01/09/13 2110 01/10/13 0750  GLUCAP 128* 136* 84    No results found for this or any previous visit (from the past 240 hour(s)).   Studies: Dg  Chest 2 View  01/09/2013   CLINICAL DATA:  Abdominal pain, nausea, vomiting  EXAM: CHEST  2 VIEW  COMPARISON:  04/12/2010  FINDINGS: The heart size and mediastinal contours are within normal limits. Elevation of the right diaphragm. Both lungs are clear. The visualized skeletal structures are unremarkable.  IMPRESSION: No active cardiopulmonary disease.   Electronically Signed   By: Elige Ko   On: 01/09/2013 08:29   Dg Abd 2 Views  01/09/2013   CLINICAL DATA:  Constipation and abdominal pain.  EXAM: ABDOMEN - 2 VIEW  COMPARISON:  None.  FINDINGS: Mild increased stool burden is noted in the colon. No obstruction. No free air.  There has been previous cholecystectomy. Some residual barium is noted in sigmoid colon diverticula. Intervertebral cages fusing L3-L4 level.  IMPRESSION: Mild increased colonic stool burden.  No obstruction.  No free air.   Electronically Signed   By: Amie Portland M.D.   On: 01/09/2013 12:43    Scheduled Meds: . docusate sodium  100 mg Oral BID  . gabapentin  300 mg Oral QPM  . insulin aspart  0-9 Units Subcutaneous TID WC  . insulin glargine  10 Units Subcutaneous QHS  . levothyroxine  112 mcg Oral QAC breakfast  . ondansetron (ZOFRAN) IV  4 mg Intravenous Q6H  . pantoprazole  40 mg Oral Daily  . piperacillin-tazobactam (ZOSYN)  IV  3.375 g Intravenous Q8H  . senna  1 tablet Oral BID  . simvastatin  20 mg Oral QPM  . sodium chloride  3 mL Intravenous Q12H  . sucralfate  1 g Oral TID AC & HS  . vancomycin  750 mg Intravenous Q12H  . Warfarin - Pharmacist Dosing Inpatient   Does not apply q1800   Continuous Infusions: . sodium chloride 100 mL/hr at 01/10/13 0725   Active Problems:   Atrial fibrillation with RVR   Hyponatremia   Dehydration   Esophageal adenocarcinoma  Luis Mclean [facility]  [facility] Pager [phone]. If 7PM-7AM, please contact night-coverage at [email or web address], password [facility] 01/10/2013, 9:56 AM  LOS: 1 day

## 2013-01-10 NOTE — Progress Notes (Signed)
ANTICOAGULATION CONSULT NOTE - Follow Up Consult  Pharmacy Consult for Warfarin Indication: atrial fibrillation  No Known Allergies  Patient Measurements: Height: 6' (182.9 cm) Weight: 187 lb 6.3 oz (85 kg) IBW/kg (Calculated) : 77.6  Vital Signs: Temp: 98.3 F (36.8 C) (11/17 0554) Temp src: Oral (11/17 0554) BP: 98/57 mmHg (11/17 0554) Pulse Rate: 98 (11/17 0554)  Labs:  Recent Labs  01/09/13 0840 01/09/13 1500 01/09/13 2026 01/10/13 0415  HGB 11.7*  --   --  10.4*  HCT 33.1*  --   --  30.0*  PLT 147*  --   --  127*  LABPROT 21.7*  --   --  21.7*  INR 1.96*  --   --  1.96*  CREATININE 1.06  --   --  1.48*  TROPONINI <0.30 <0.30 <0.30  --     Estimated Creatinine Clearance: 43 ml/min (by C-G formula based on Cr of 1.48).   Medications:  Scheduled:  . docusate sodium  100 mg Oral BID  . gabapentin  300 mg Oral QPM  . insulin aspart  0-9 Units Subcutaneous TID WC  . insulin glargine  10 Units Subcutaneous QHS  . levothyroxine  112 mcg Oral QAC breakfast  . ondansetron (ZOFRAN) IV  4 mg Intravenous Q6H  . pantoprazole  40 mg Oral Daily  . piperacillin-tazobactam (ZOSYN)  IV  3.375 g Intravenous Q8H  . senna  1 tablet Oral BID  . simvastatin  20 mg Oral QPM  . sodium chloride  3 mL Intravenous Q12H  . sucralfate  1 g Oral TID AC & HS  . vancomycin  750 mg Intravenous Q12H  . Warfarin - Pharmacist Dosing Inpatient   Does not apply q1800   Infusions:  . sodium chloride 100 mL/hr at 01/10/13 0725    Assessment: 79 yo male with metastatic esophageal cancer presented to ER with CC nausea, vomiting and fatigue. Pharmacy ordered to continue warfarin dosing for patient's history of afib. Note that patient reportedly takes 7.5mg  daily except 5mg  MonWedFri with last dose 11/13 and patient was instructed to hold doses because of an INR of 4.62 until INR rechecked on Monday 11/17.  INR 1.96 again today, 5mg  warfarin given last night  CBC slightly decreased, no  bleeding reported  Likely more sensitive to warfarin currently with decrease PO intake - advance to full liquids today  Goal of Therapy:  INR 2-3   Plan:   Repeat warfarin 5mg  today  Daily PT/INR  Loralee Pacas, PharmD, BCPS Pager: [phone] 01/10/2013,10:20 AM

## 2013-01-10 NOTE — Progress Notes (Signed)
IP PROGRESS NOTE  Subjective:   He was admitted yesterday with subxiphoid pain and the inability to eat and drink. He reports feeling better today. He had nausea yesterday.  Objective: Vital signs in last 24 hours: Blood pressure 101/65, pulse 92, temperature 98 F (36.7 C), temperature source Oral, resp. rate 18, height 6' (1.829 m), weight 187 lb 6.3 oz (85 kg), SpO2 96.00%.  Intake/Output from previous day: 11/16 0701 - 11/17 0700 In: 600 [P.O.:50; I.V.:300; IV Piggyback:250] Out: 450 [Urine:450]  Physical Exam:  HEENT: No thrush Lungs: Decreased breath sounds with end inspiratory rhonchi at the posterior base bilaterally, no respiratory distress Cardiac: Irregular Abdomen: No hepatomegaly, nontender Extremities: No leg edema     Lab Results:  Recent Labs  01/09/13 0840 01/10/13 0415  WBC 4.8 4.3  HGB 11.7* 10.4*  HCT 33.1* 30.0*  PLT 147* 127*    BMET  Recent Labs  01/09/13 0840 01/10/13 0415  NA 125* 129*  K 5.0 4.2  CL 94* 98  CO2 23 25  GLUCOSE 173* 91  BUN 15 20  CREATININE 1.06 1.48*  CALCIUM 8.4 8.5    Studies/Results: Dg Chest 2 View  01/09/2013   CLINICAL DATA:  Abdominal pain, nausea, vomiting  EXAM: CHEST  2 VIEW  COMPARISON:  04/12/2010  FINDINGS: The heart size and mediastinal contours are within normal limits. Elevation of the right diaphragm. Both lungs are clear. The visualized skeletal structures are unremarkable.  IMPRESSION: No active cardiopulmonary disease.   Electronically Signed   By: Elige Ko   On: 01/09/2013 08:29   Dg Abd 2 Views  01/09/2013   CLINICAL DATA:  Constipation and abdominal pain.  EXAM: ABDOMEN - 2 VIEW  COMPARISON:  None.  FINDINGS: Mild increased stool burden is noted in the colon. No obstruction. No free air.  There has been previous cholecystectomy. Some residual barium is noted in sigmoid colon diverticula. Intervertebral cages fusing L3-L4 level.  IMPRESSION: Mild increased colonic stool burden.  No  obstruction.  No free air.   Electronically Signed   By: Amie Portland M.D.   On: 01/09/2013 12:43    Medications: I have reviewed the patient's current medications.  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  Initiation of radiation and weekly Taxol/carboplatin 12/06/2012.  Radiation completed 01/05/2013 2. Indeterminate right hepatic mass on ultrasound-10/20/2012/MRI-10/29/2012 , hypermetabolic on the PET scan 12/02/2012. 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  3. Dysphagia/subxiphoid pain secondary to #1. Improved.  4. History of atrial fibrillation-maintained on Coumadin.  5. Renal insufficiency.  6. History of a CVA.  7. 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  8. Diabetes.  9. 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 Fever 01/09/2013-? Related to esophagitis, no apparent source for infection   He was admitted on 01/09/2013 with failure to thrive and dehydration. His symptoms are likely related to radiation esophagitis.  Recommendations:  1. Continue intravenous fluids, advanced diet as tolerated  2. Antacids and narcotic analgesics as needed for the esophagitis   I will continue checking on him in the hospital. Please call oncology as needed. We will see him for outpatient followup as scheduled to discuss the indication for additional chemotherapy.   I appreciate the care from the hospitalist service.    LOS: 1 day  Luis Mclean  01/10/2013, 2:00 PM

## 2013-01-10 NOTE — Progress Notes (Signed)
INITIAL NUTRITION ASSESSMENT  DOCUMENTATION CODES Per approved criteria  -Not Applicable   INTERVENTION: Provide Boost Plus once daily Provide Ensure Complete once daily Provide Multivitamin with minerals daily  NUTRITION DIAGNOSIS: Inadequate oral intake related to esophagitis as evidenced by 3% wt loss in less than 2 weeks.  Goal: Pt to meet >/= 90% of their estimated nutrition needs   Monitor:  PO intake Diet advancement Weight Labs  Reason for Assessment: Malnutrition Screening Tool, score of 3  79 y.o. 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 The patient states that he has had odynophagia and increasing difficulty eating and drinking since the start of radiation therapy for his esophageal adenocarcinoma.   Pt reports losing weight due to poor appetite and having esophagitis for the past 3 months. Pt reports not eating for the past 2-3 days but, has a better appetite today. Pt states he was eating a regular diet PTA and reports drinking Boost and Ensure supplements PTA to help him maintain his weight. Pt reports weighing 210 lbs a couple years ago. Weight history shows 3% wt loss in less than 2 weeks.  Lab Results  Component Value Date   HGBA1C 9.6* 01/09/2013     Height: Ht Readings from Last 1 Encounters:  01/09/13 6' (1.829 m)    Weight: Wt Readings from Last 1 Encounters:  01/09/13 187 lb 6.3 oz (85 kg)    Ideal Body Weight: 178 lbs  % Ideal Body Weight: 105%  Wt Readings from Last 10 Encounters:  01/09/13 187 lb 6.3 oz (85 kg)  01/06/13 188 lb 11.2 oz (85.594 kg)  01/03/13 188 lb 3.2 oz (85.367 kg)  12/29/12 193 lb 4.8 oz (87.68 kg)  12/24/12 192 lb 3.2 oz (87.181 kg)   12/20/12 192 lb 9.6 oz (87.363 kg)  12/16/12 193 lb 6.4 oz (87.726 kg)  12/10/12 192 lb 14.4 oz (87.499 kg)  12/06/12 191 lb (86.637 kg)  05/06/11 207 lb 6.4 oz (94.076 kg)    Usual Body Weight: 210 lbs (over one year ago)  % Usual Body Weight: 89%  BMI:  Body mass index is 25.41 kg/(m^2).  Estimated Nutritional Needs: Kcal: 2000-2200 Protein: 100-120 grams Fluid: 2-2.2 L/day  Skin: intact  Diet Order: Full Liquid  EDUCATION NEEDS: -No education needs identified at this time   Intake/Output Summary (Last 24 hours) at 01/10/13 1548 Last data filed at 01/10/13 1237  Gross per 24 hour  Intake 1681.67 ml  Output    650 ml  Net 1031.67 ml    Last BM: 11/12  Labs:   Recent Labs Lab 01/09/13 0840 01/10/13 0415  NA 125* 129*  K 5.0 4.2  CL 94* 98  CO2 23 25  BUN 15 20  CREATININE 1.06 1.48*  CALCIUM 8.4 8.5  GLUCOSE 173* 91    CBG (last 3)   Recent Labs  01/09/13 2110 01/10/13 0750 01/10/13 1206  GLUCAP 136* 84 107*    Scheduled Meds: . docusate sodium  100 mg Oral BID  . gabapentin  300 mg Oral QPM  . insulin aspart  0-9 Units Subcutaneous TID WC  . insulin glargine  10 Units Subcutaneous QHS  . levothyroxine  112 mcg Oral QAC breakfast  . ondansetron (ZOFRAN) IV  4 mg Intravenous Q6H  . pantoprazole  40 mg Oral Daily  . piperacillin-tazobactam (ZOSYN)  IV  3.375 g Intravenous Q8H  . senna  1 tablet Oral BID  . simvastatin  20 mg Oral QPM  . sodium chloride  3 mL Intravenous Q12H  . sucralfate  1 g Oral TID AC & HS  . vancomycin  750 mg Intravenous Q12H  . warfarin  5 mg Oral ONCE-1800  . Warfarin - Pharmacist Dosing Inpatient   Does not apply q1800    Continuous Infusions: . sodium chloride 100 mL/hr at 01/10/13 0725    Past Medical History  Diagnosis Date  . Hyperlipidemia   . Hypertension   . Stroke   . GERD (gastroesophageal reflux disease)   . Cyst     back of neck  . Esophageal cancer 11/08/12    squmous cell and focal  glandular diff  . Diabetes mellitus     type II  . A-fib   . Thyroid disease     Past Surgical History  Procedure Laterality Date  . Cholecystectomy  1989  . Rectal fissure repair  1985  . Back surgery  1989  . Esophageal biopsy  11/08/12    squamous cell & focal glandular diff    Ian Malkin RD, LDN Inpatient Clinical Dietitian Pager: [phone] After Hours Pager: [phone]

## 2013-01-10 NOTE — Evaluation (Signed)
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 The patient states that he has had odynophagia and increasing difficulty eating and drinking since the start of radiation therapy for his esophageal adenocarcinoma. He describes a burning in his chest which worsened in the past few days. Last evening, he experienced dry heaves. He checked his blood pressure with his automated machine at home. He states that his heart rate was in the 130s with systolic blood pressure in the 130s. Because of his decreased by mouth intake and worsening weakness, he came to the emergency department for further evaluation. His last radiation therapy was on 01/05/2013. His last chemotherapy was 2 weeks ago. He complained of some chest discomfort as well as shortness of breath last night. He states that his breathing has improved. He also had some dizziness without any syncope or falls. He denied any abdominal pain, dysuria, hematuria, headache, visual disturbance, hematochezia, melena, hemoptysis, coughing. His temperature at home was 100.13F.  Clinical Impression  Pt admitted with above. Pt currently with functional limitations due to the deficits listed below (see PT Problem List).  Pt will benefit from skilled PT to increase their independence and safety with mobility to allow discharge to the venue listed below.  Pt presents with generalized weakness and unable to ambulate today however assisted to recliner and performed a few exercises.  Recommended ST-SNF prior to home as spouse  would be unable to care for pt in current condition, and pt would like to see how he progresses.     PT Assessment  Patient needs continued PT services    Follow Up Recommendations  SNF    Does the patient have the potential to tolerate intense rehabilitation      Barriers to Discharge        Equipment Recommendations  None recommended by PT    Recommendations for Other Services     Frequency Min 3X/week    Precautions / Restrictions Precautions Precautions: Fall   Pertinent Vitals/Pain Pt with epigastric pain earlier this morning and waiting for pain meds so he could eat breakfast however upon checking back this afternoon pt reports feeling better and did not c/o pain.      Mobility  Bed Mobility Bed Mobility: Supine to Sit Supine to Sit: 4: Min assist Details for Bed Mobility Assistance: assist for trunk Transfers Transfers: Sit to Stand;Stand to Sit;Stand Pivot Transfers Sit to Stand: 3: Mod assist;With upper extremity assist;From bed Stand to Sit: 3: Mod assist;To chair/3-in-1 Stand Pivot Transfers: 3: Mod assist Details for Transfer Assistance: verbal cues for safe technique, assist to rise and steady as well as control descent, mod assist due to weakness and for balance as pt presents with posterior lean upon weight shifting Ambulation/Gait Ambulation/Gait Assistance: Not tested (comment) (pt felt too weak for ambulation today)    Exercises General Exercises - Lower Extremity Ankle Circles/Pumps: AROM;Left;10 reps;Seated Long Arc Quad: AROM;Both;10 reps;Seated Hip ABduction/ADduction: AROM;Both;10 reps;Supine Straight Leg Raises: AROM;Both;5 reps;Supine Hip Flexion/Marching: AROM;Both;10 reps;Seated   PT Diagnosis: Difficulty walking;Generalized weakness  PT Problem List: Decreased strength;Decreased activity tolerance;Decreased mobility;Decreased balance;Decreased safety awareness;Decreased knowledge of use of DME PT Treatment Interventions:  Gait training;DME  instruction;Functional mobility training;Therapeutic activities;Therapeutic exercise;Patient/family education;Balance training;Neuromuscular re-education     PT Goals(Current goals can be found in the care plan section) Acute Rehab PT Goals Patient Stated Goal: get stronger before leaving PT Goal Formulation: With patient Time For Goal Achievement: 01/24/13 Potential to Achieve Goals: Good  Visit Information  Last PT Received On: 01/10/13 Assistance Needed: +2 (ambulation) History of Present Illness: 79 year old male with a history of 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 The patient states that he has had odynophagia and increasing difficulty eating and drinking since the start of radiation therapy for his esophageal adenocarcinoma. He describes a burning in his chest which worsened in the past few days. Last evening, he experienced dry heaves. He checked his blood pressure with his automated machine at home. He states that his heart rate was in the 130s with systolic blood pressure in the 130s. Because of his decreased by mouth intake and worsening weakness, he came to the emergency department for further evaluation. His last radiation therapy was on 01/05/2013. His last chemotherapy was 2 weeks ago. He complained of some chest discomfort as well as shortness of breath last night. He states that his breathing has improved. He also had some dizziness without any syncope or falls. He denied any abdominal pain, dysuria, hematuria, headache, visual disturbance, hematochezia, melena, hemoptysis, coughing. His temperature at home was 100.47F.       Prior Functioning  Home Living Family/patient expects to be discharged to:: Private residence Living Arrangements: Spouse/significant other Type of Home: House Home  Access: Stairs to enter Secretary/administrator of Steps: 2 Entrance Stairs-Rails: Right Home Layout: Able to live on main level with bedroom/bathroom Home Equipment: Walker - 2 wheels Prior Function Level of Independence: Independent with assistive device(s) Comments: normally independent however daughter states pt has been using RW recently due to weakness Communication Communication: No difficulties    Cognition  Cognition Arousal/Alertness: Awake/alert Behavior During Therapy: WFL for tasks assessed/performed Overall Cognitive Status: Within Functional Limits for tasks assessed    Extremity/Trunk Assessment Lower Extremity Assessment Lower Extremity Assessment: Generalized weakness;RLE deficits/detail RLE Deficits / Details: hx of CVA with residual R LE weakness and foot drop, pt reports he does not wear AFO RLE Sensation: history of peripheral neuropathy (bil hx of neuropathy)   Balance    End of Session PT - End of Session Equipment Utilized During Treatment: Gait belt Activity Tolerance: Patient limited by fatigue Patient left: in chair;with call bell/phone within reach;with family/visitor present (pt and family aware to call for assist back to bed) Nurse Communication: Mobility status  GP     Natalee Tomkiewicz,KATHrine E 01/10/2013, 2:47 PM Zenovia Jarred, PT, DPT 01/10/2013 Pager: [phone]

## 2013-01-11 ENCOUNTER — Ambulatory Visit: Payer: Medicare Other

## 2013-01-11 DIAGNOSIS — E860 Dehydration: Secondary | ICD-10-CM

## 2013-01-11 DIAGNOSIS — E871 Hypo-osmolality and hyponatremia: Principal | ICD-10-CM

## 2013-01-11 DIAGNOSIS — C159 Malignant neoplasm of esophagus, unspecified: Secondary | ICD-10-CM

## 2013-01-11 LAB — CBC WITH DIFFERENTIAL/PLATELET
Basophils Absolute: 0 10*3/uL (ref 0.0–0.1)
Basophils Relative: 0 % (ref 0–1)
Eosinophils Relative: 7 % — ABNORMAL HIGH (ref 0–5)
HCT: 30 % — ABNORMAL LOW (ref 39.0–52.0)
Hemoglobin: 10.4 g/dL — ABNORMAL LOW (ref 13.0–17.0)
Lymphocytes Relative: 14 % (ref 12–46)
MCHC: 34.7 g/dL (ref 30.0–36.0)
Monocytes Absolute: 0.4 10*3/uL (ref 0.1–1.0)
Neutro Abs: 2.1 10*3/uL (ref 1.7–7.7)
Platelets: 112 10*3/uL — ABNORMAL LOW (ref 150–400)
RDW: 15.6 % — ABNORMAL HIGH (ref 11.5–15.5)
WBC: 3.2 10*3/uL — ABNORMAL LOW (ref 4.0–10.5)

## 2013-01-11 LAB — COMPREHENSIVE METABOLIC PANEL
ALT: 6 U/L (ref 0–53)
Alkaline Phosphatase: 47 U/L (ref 39–117)
BUN: 22 mg/dL (ref 6–23)
CO2: 23 mEq/L (ref 19–32)
Chloride: 96 mEq/L (ref 96–112)
GFR calc Af Amer: 46 mL/min — ABNORMAL LOW (ref >90)
Glucose, Bld: 150 mg/dL — ABNORMAL HIGH (ref 70–99)
Potassium: 3.7 mEq/L (ref 3.5–5.1)
Sodium: 127 mEq/L — ABNORMAL LOW (ref 135–145)
Total Bilirubin: 0.3 mg/dL (ref 0.3–1.2)

## 2013-01-11 LAB — GLUCOSE, CAPILLARY
Glucose-Capillary: 236 mg/dL — ABNORMAL HIGH (ref 70–99)
Glucose-Capillary: 256 mg/dL — ABNORMAL HIGH (ref 70–99)
Glucose-Capillary: 171 mg/dL — ABNORMAL HIGH (ref 70–99)

## 2013-01-11 LAB — VANCOMYCIN, TROUGH: Vancomycin Tr: 18.5 ug/mL (ref 10.0–20.0)

## 2013-01-11 MED ORDER — VANCOMYCIN HCL 10 G IV SOLR
1250.0000 mg | INTRAVENOUS | Status: DC
  Administered 2013-01-11: 23:00:00 1250 mg via INTRAVENOUS
  Filled 2013-01-11 (×2): qty 1250

## 2013-01-11 MED ORDER — WARFARIN 0.5 MG HALF TABLET
0.5000 mg | ORAL_TABLET | Freq: Once | ORAL | Status: AC
  Administered 2013-01-11: 0.5 mg via ORAL
  Filled 2013-01-11: qty 1

## 2013-01-11 MED ORDER — INSULIN GLARGINE 100 UNIT/ML ~~LOC~~ SOLN
12.0000 [IU] | Freq: Every day | SUBCUTANEOUS | Status: DC
  Administered 2013-01-11 – 2013-01-12 (×2): 12 [IU] via SUBCUTANEOUS
  Filled 2013-01-11 (×4): qty 0.12

## 2013-01-11 NOTE — Progress Notes (Signed)
ANTICOAGULATION CONSULT NOTE - Follow Up Consult  Pharmacy Consult for Warfarin Indication: atrial fibrillation  No Known Allergies  Patient Measurements: Height: 6' (182.9 cm) Weight: 187 lb 6.3 oz (85 kg) IBW/kg (Calculated) : 77.6  Vital Signs: Temp: 98.1 F (36.7 C) (11/18 0627) Temp src: Oral (11/18 0627) BP: 128/88 mmHg (11/18 0627) Pulse Rate: 103 (11/18 0627)  Labs:  Recent Labs  01/09/13 0840 01/09/13 1500 01/09/13 2026 01/10/13 0415 01/11/13 0340  HGB 11.7*  --   --  10.4* 10.4*  HCT 33.1*  --   --  30.0* 30.0*  PLT 147*  --   --  127* 112*  LABPROT 21.7*  --   --  21.7* 29.0*  INR 1.96*  --   --  1.96* 2.86*  CREATININE 1.06  --   --  1.48* 1.58*  TROPONINI <0.30 <0.30 <0.30  --   --     Estimated Creatinine Clearance: 40.2 ml/min (by C-G formula based on Cr of 1.58).   Medications:  Scheduled:  . docusate sodium  100 mg Oral BID  . feeding supplement (ENSURE)  1 Container Oral Q24H  . gabapentin  300 mg Oral QPM  . insulin aspart  0-9 Units Subcutaneous TID WC  . insulin glargine  10 Units Subcutaneous QHS  . lactose free nutrition  237 mL Oral Q24H  . levothyroxine  112 mcg Oral QAC breakfast  . multivitamin with minerals  1 tablet Oral Daily  . ondansetron (ZOFRAN) IV  4 mg Intravenous Q6H  . pantoprazole  40 mg Oral Daily  . piperacillin-tazobactam (ZOSYN)  IV  3.375 g Intravenous Q8H  . senna  1 tablet Oral BID  . simvastatin  20 mg Oral QPM  . sodium chloride  3 mL Intravenous Q12H  . sucralfate  1 g Oral TID AC & HS  . vancomycin  750 mg Intravenous Q12H  . Warfarin - Pharmacist Dosing Inpatient   Does not apply q1800   Infusions:  . sodium chloride 100 mL/hr at 01/11/13 0808    Assessment: 79 yo male with metastatic esophageal cancer presented to ER with CC nausea, vomiting and fatigue. Pharmacy ordered to continue warfarin dosing for patient's history of afib. Note that patient reportedly takes 7.5mg  daily except 5mg  MonWedFri with  last dose 11/13 and patient was instructed to hold doses because of an INR of 4.62 until INR rechecked on Monday 11/17.  INR now rising quickly (1.96 > 2.86)  Hbg slightly decreased, Plts falling, no bleeding reported  Likely more sensitive to warfarin currently with decrease PO intake - advanced to full liquids  Goal of Therapy:  INR 2-3   Plan:   Decrease warfarin 0.5mg  today  Daily PT/INR  Loralee Pacas, PharmD, BCPS Pager: [phone] 01/11/2013,8:58 AM

## 2013-01-11 NOTE — Progress Notes (Signed)
Clinical Social Work Department BRIEF PSYCHOSOCIAL ASSESSMENT 01/11/2013  Patient:  Luis Mclean, Luis Mclean     Account Number:  [id number]     Admit date:  01/09/2013  Clinical Social Worker:  Orpah Greek  Date/Time:  01/11/2013 02:48 PM  Referred by:  Physician  Date Referred:  01/11/2013 Referred for  SNF Placement   Other Referral:   Interview type:  Patient Other interview type:   and wife at bedside    PSYCHOSOCIAL DATA Living Status:  WIFE Admitted from facility:   Level of care:   Primary support name:  Luis Mclean (wife) ph#: [phone] c#: [phone] Primary support relationship to patient:  SPOUSE Degree of support available:   good    CURRENT CONCERNS Current Concerns  Post-Acute Placement   Other Concerns:    SOCIAL WORK ASSESSMENT / PLAN CSW reviewed PT evaluation recommending SNF for patient at discharge.   Assessment/plan status:  Information/Referral to [facility] Other assessment/ plan:   Information/referral to community resources:   CSW provided list of SNFs to patient/wife, incase patient gets home and declines more, requiring a SNF stay.    PATIENT'S/FAMILY'S RESPONSE TO PLAN OF CARE: Patient declined SNF, stating that he would prefer to return home with his wife and received home health services. Patient's wife states that she went to [facility] back in January following a hip procedure, but feels that she would be able to take care of him at home as long as he's able to ambulate to the bathroom using his rolling walker. PT to work with him again tomorrow.       Unice Bailey, LCSW [facility] Clinical Social Worker cell #: [phone]

## 2013-01-11 NOTE — Progress Notes (Signed)
CARE MANAGEMENT NOTE 01/11/2013  Patient:  Luis Mclean, Luis Mclean   Account Number:  [id number]  Date Initiated:  01/11/2013  Documentation initiated by:  DAVIS,RHONDA  Subjective/Objective Assessment:   pt with hx of a.fib, on coumadin,presents with na 125, k5.0 poor po intake, hypotensive and presyncopal     Action/Plan:   home when stable   Anticipated DC Date:  01/14/2013   Anticipated DC Plan:  HOME/SELF CARE  In-house referral  NA      DC Planning Services  NA      [facility] Choice  NA   Choice offered to / List presented to:  NA   DME arranged  NA      DME agency  NA     HH arranged  NA      HH agency  NA   Status of service:  In process, will continue to follow Medicare Important Message given?  NA - LOS <3 / Initial given by admissions (If response is "NO", the following Medicare IM given date fields will be blank) Date Medicare IM given:   Date Additional Medicare IM given:    Discharge Disposition:    Per UR Regulation:  Reviewed for med. necessity/level of care/duration of stay  If discussed at Long Length of Stay Meetings, dates discussed:    Comments:  [id number]/Rhonda Earlene Plater RN,BSN,CCM: Case management [phone] Chart reviewed and updated.  Next chart review due on [id number]. Needs for discharge at time of review:  None

## 2013-01-11 NOTE — Progress Notes (Signed)
ANTIBIOTIC CONSULT NOTE - FOLLOW UP  Pharmacy Consult for Vancomycin Indication: rule out sepsis  No Known Allergies  Patient Measurements: Height: 6' (182.9 cm) Weight: 187 lb 6.3 oz (85 kg) IBW/kg (Calculated) : 77.6  Vital Signs: Temp: 98 F (36.7 C) (11/18 1356) Temp src: Oral (11/18 1356) BP: 110/60 mmHg (11/18 1356) Pulse Rate: 99 (11/18 1356) Intake/Output from previous day: 11/17 0701 - 11/18 0700 In: 3890 [P.O.:240; I.V.:3200; IV Piggyback:450] Out: 975 [Urine:975] Intake/Output from this shift:    Labs:  Recent Labs  01/09/13 0840 01/10/13 0415 01/11/13 0340  WBC 4.8 4.3 3.2*  HGB 11.7* 10.4* 10.4*  PLT 147* 127* 112*  CREATININE 1.06 1.48* 1.58*   Estimated Creatinine Clearance: 40.2 ml/min (by C-G formula based on Cr of 1.58).  36 ml/min/1.8m2 (normalized)  Recent Labs  01/11/13 1905  VANCOTROUGH 18.5     Microbiology: Recent Results (from the past 720 hour(s))  URINE CULTURE     Status: None   Collection Time    01/09/13  9:04 AM      Result Value Range Status   Specimen Description URINE, CLEAN CATCH   Final   Special Requests NONE   Final   Culture  Setup Time     Final   Value: 01/09/2013 16:55     Performed at [facility]   Culture     Final   Value: INSIGNIFICANT GROWTH     Performed at [facility]   Report Status 01/10/2013 FINAL   Final  CULTURE, BLOOD (ROUTINE X 2)     Status: None   Collection Time    01/09/13  6:30 PM      Result Value Range Status   Specimen Description BLOOD RIGHT ARM   Final   Special Requests     Final   Value: BOTTLES DRAWN AEROBIC AND ANAEROBIC 5 CC BOTH BOTTLES   Culture  Setup Time     Final   Value: 01/10/2013 04:31     Performed at [facility]   Culture     Final   Value:        BLOOD CULTURE RECEIVED NO GROWTH TO DATE CULTURE WILL BE HELD FOR 5 DAYS BEFORE ISSUING A FINAL NEGATIVE REPORT     Performed at [facility]   Report Status PENDING   Incomplete   CULTURE, BLOOD (ROUTINE X 2)     Status: None   Collection Time    01/09/13  6:35 PM      Result Value Range Status   Specimen Description BLOOD RIGHT HAND   Final   Special Requests BOTTLES DRAWN AEROBIC ONLY 3CC   Final   Culture  Setup Time     Final   Value: 01/10/2013 04:31     Performed at [facility]   Culture     Final   Value:        BLOOD CULTURE RECEIVED NO GROWTH TO DATE CULTURE WILL BE HELD FOR 5 DAYS BEFORE ISSUING A FINAL NEGATIVE REPORT     Performed at [facility]   Report Status PENDING   Incomplete    Anti-infectives: 11/16 >> Vancomycin >> 11/16 >> Zosyn >>  Assessment: 79 yo presented to ER with NV and fatigue with hx metastatic esophageal cancer. Started on broad spectrum abx for possible SIRS/sepsis.  Day #3 vancomycin 750mg  q12h and zosyn 3.375g IV q8h  SCr rising (1.06 > 1.48 > 1.58), UOP recorded in past 24hr  CrCl(n) = 38 ml/min  Afebrile  WBC decreased to 3.2k  Blood and urine cultures neg to date  Vancomycin trough level = 18.5 mcg/ml, which is within therapeutic range.   However given patient's age and worsening renal function am concerned for potential accumulation.  Will adjust vancomycin to a q24h regimen and will recheck levels as appropriate  Goal of Therapy:  Vancomycin trough level 15-20 mcg/ml Zosyn dose per renal function  Plan:   Change Vancomycin to 1250mg  IV q24h  Continue Zosyn 3.375gm IV q8h (4hr extended infusions) per MD Follow up renal function & cultures, de-escalation of therapy  Terrilee Files, PharmD  01/11/2013,8:31 PM

## 2013-01-11 NOTE — Progress Notes (Signed)
ANTIBIOTIC CONSULT NOTE - FOLLOW UP  Pharmacy Consult for Vancomycin Indication: rule out sepsis  No Known Allergies  Patient Measurements: Height: 6' (182.9 cm) Weight: 187 lb 6.3 oz (85 kg) IBW/kg (Calculated) : 77.6  Vital Signs: Temp: 98.1 F (36.7 C) (11/18 0627) Temp src: Oral (11/18 0627) BP: 128/88 mmHg (11/18 0627) Pulse Rate: 103 (11/18 0627) Intake/Output from previous day: 11/17 0701 - 11/18 0700 In: 3890 [P.O.:240; I.V.:3200; IV Piggyback:450] Out: 975 [Urine:975] Intake/Output from this shift: Total I/O In: 240 [P.O.:240] Out: -   Labs:  Recent Labs  01/09/13 0840 01/10/13 0415 01/11/13 0340  WBC 4.8 4.3 3.2*  HGB 11.7* 10.4* 10.4*  PLT 147* 127* 112*  CREATININE 1.06 1.48* 1.58*   Estimated Creatinine Clearance: 40.2 ml/min (by C-G formula based on Cr of 1.58).  36 ml/min/1.40m2 (normalized) No results found for this basename: VANCOTROUGH, VANCOPEAK, VANCORANDOM, GENTTROUGH, GENTPEAK, GENTRANDOM, TOBRATROUGH, TOBRAPEAK, TOBRARND, AMIKACINPEAK, AMIKACINTROU, AMIKACIN,  in the last 72 hours   Microbiology: Recent Results (from the past 720 hour(s))  URINE CULTURE     Status: None   Collection Time    01/09/13  9:04 AM      Result Value Range Status   Specimen Description URINE, CLEAN CATCH   Final   Special Requests NONE   Final   Culture  Setup Time     Final   Value: 01/09/2013 16:55     Performed at [facility]   Culture     Final   Value: INSIGNIFICANT GROWTH     Performed at [facility]   Report Status 01/10/2013 FINAL   Final  CULTURE, BLOOD (ROUTINE X 2)     Status: None   Collection Time    01/09/13  6:30 PM      Result Value Range Status   Specimen Description BLOOD RIGHT ARM   Final   Special Requests     Final   Value: BOTTLES DRAWN AEROBIC AND ANAEROBIC 5 CC BOTH BOTTLES   Culture  Setup Time     Final   Value: 01/10/2013 04:31     Performed at [facility]   Culture     Final   Value:        BLOOD  CULTURE RECEIVED NO GROWTH TO DATE CULTURE WILL BE HELD FOR 5 DAYS BEFORE ISSUING A FINAL NEGATIVE REPORT     Performed at [facility]   Report Status PENDING   Incomplete  CULTURE, BLOOD (ROUTINE X 2)     Status: None   Collection Time    01/09/13  6:35 PM      Result Value Range Status   Specimen Description BLOOD RIGHT HAND   Final   Special Requests BOTTLES DRAWN AEROBIC ONLY 3CC   Final   Culture  Setup Time     Final   Value: 01/10/2013 04:31     Performed at [facility]   Culture     Final   Value:        BLOOD CULTURE RECEIVED NO GROWTH TO DATE CULTURE WILL BE HELD FOR 5 DAYS BEFORE ISSUING A FINAL NEGATIVE REPORT     Performed at [facility]   Report Status PENDING   Incomplete    Anti-infectives: 11/16 >> Vancomycin >> 11/16 >> Zosyn >>  Assessment: 79 yo presented to ER with NV and fatigue with hx metastatic esophageal cancer. Started on broad spectrum abx for possible SIRS/sepsis.  Day #3 vancomycin 750mg  q12h  and zosyn 3.375g IV q8h  SCr rising (1.06 > 1.48 > 1.58), UOP recorded in past 24hr  Afebrile  WBC decreased to 3.2k  Blood and urine cultures neg to date  Goal of Therapy:  Vancomycin trough level 15-20 mcg/ml Zosyn dose per renal function  Plan:   Check Vancomycin trough tonight before 5th dose  Continue Zosyn 3.375gm IV q8h (4hr extended infusions) per MD Follow up renal function & cultures, de-escalation of therapy  Loralee Pacas, PharmD, BCPS Pager: [phone] 01/11/2013,1:18 PM

## 2013-01-11 NOTE — Evaluation (Signed)
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 The patient states that he has had odynophagia and increasing difficulty eating and drinking since the start of radiation therapy for his esophageal adenocarcinoma. He describes a burning in his chest which worsened in the past few days. Last evening, he experienced dry heaves. He checked his blood pressure with his automated machine at home. He states that his heart rate was in the 130s with systolic blood pressure in the 130s. Because of his decreased by mouth intake and worsening weakness, he came to the emergency department for further evaluation. His last radiation therapy was on 01/05/2013. His last chemotherapy was 2 weeks ago. He complained of some chest discomfort as well as shortness of breath last night. He states that his breathing has improved. He also had some dizziness without any syncope or falls. He denied any abdominal pain, dysuria, hematuria, headache, visual disturbance, hematochezia, melena, hemoptysis, coughing. His temperature at home was 100.57F.   Clinical Impression   Pt will benefit from continued OT services to improve ADL independence for next venue. Pt shaky in LEs during standing and pivot to chair. Pt states he feels "weak" with activity.     OT Assessment  Patient needs continued OT Services    Follow Up Recommendations  Supervision/Assistance - 24 hour;SNF    Barriers to Discharge      Equipment Recommendations  None recommended by OT    Recommendations for  Other Services    Frequency  Min 2X/week    Precautions / Restrictions Precautions Precautions: Fall Restrictions Weight Bearing Restrictions: No   Pertinent Vitals/Pain No complaint of HR 106 with activity at EOB    ADL  Grooming: Wash/dry hands;Set up Where Assessed - Grooming: Supported sitting Upper Body Bathing: Chest;Right arm;Left arm;Abdomen;Set up;Supervision/safety Where Assessed - Upper Body Bathing: Unsupported sitting Lower Body Bathing: Minimal assistance;Moderate assistance Where Assessed - Lower Body Bathing: Supported sit to stand Upper Body Dressing: Set up;Supervision/safety Where Assessed - Upper Body Dressing: Unsupported sitting Lower Body Dressing: Minimal assistance;Moderate assistance Where Assessed - Lower Body Dressing: Supported sit to stand Toilet Transfer: Minimal assistance;Moderate assistance Toilet Transfer Method: Stand pivot Toileting - Clothing Manipulation and Hygiene: Minimal assistance;Moderate assistance Where Assessed - Toileting Clothing Manipulation and Hygiene: Standing Equipment Used: Rolling walker;Gait belt ADL Comments: Pt states he feels weak overall. Noted bilateral LE shaky during transfer only to chair. Pt states he would like to go home but discussed he may benefit from SNF prior to d/c home. Wife present also.     OT Diagnosis: Generalized weakness  OT Problem List: Decreased strength OT Treatment Interventions: Self-care/ADL training;DME and/or AE instruction;Therapeutic activities;Patient/family education   OT Goals(Current goals can be found in the care plan section) Acute Rehab OT Goals Patient Stated Goal: get stronger before leaving OT Goal Formulation: With patient/family Time For Goal Achievement: 01/25/13 Potential to Achieve Goals: Good  Visit Information  Last OT Received On: 01/11/13 Assistance Needed: +2 History of Present Illness: 79 year old male with a history of metastatic esophageal adenocarcinoma to  the liver, hypertension, hyperlipidemia, diabetes mellitus, and atrial fibrillation on Coumadin presents with one-day history of nausea and vomiting  with worsening generalized weakness. The patient states that he has had odynophagia and increasing difficulty eating and drinking since the start of radiation therapy for his esophageal adenocarcinoma. He describes a burning in his chest which worsened in the past few days. Last evening, he experienced dry heaves. He checked his blood pressure with his automated machine at home. He states that his heart rate was in the 130s with systolic blood pressure in the 130s. Because of his decreased by mouth intake and worsening weakness, he came to the emergency department for further evaluation. His last radiation therapy was on 01/05/2013. His last chemotherapy was 2 weeks ago. He complained of some chest discomfort as well as shortness of breath last night. He states that his breathing has improved. He also had some dizziness without any syncope or falls. He denied any abdominal pain, dysuria, hematuria, headache, visual disturbance, hematochezia, melena, hemoptysis, coughing. His temperature at home was 100.67F.       Prior Functioning     Home Living Family/patient expects to be discharged to:: Private residence Living Arrangements: Spouse/significant other Type of Home: House Home Access: Stairs to enter [employer] of Steps: 2 Entrance Stairs-Rails: Right Home Layout: Able to live on main level with bedroom/bathroom Home Equipment: Walker - 2 wheels;Bedside commode;Shower seat Prior Function Level of Independence: Independent with assistive device(s) Comments: normally independent however daughter states pt has been using RW recently due to weakness. usually able to do own ADL but pt states wife has been helping for last couple of weeks more Communication Communication: No difficulties         Vision/Perception     Cognition   Cognition Arousal/Alertness: Awake/alert Behavior During Therapy: WFL for tasks assessed/performed Overall Cognitive Status: Within Functional Limits for tasks assessed    Extremity/Trunk Assessment Upper Extremity Assessment Upper Extremity Assessment: Generalized weakness     Mobility Bed Mobility Bed Mobility: Supine to Sit Supine to Sit: 4: Min guard;HOB elevated Transfers Transfers: Sit to Stand;Stand to Sit Sit to Stand: 4: Min assist;With upper extremity assist;From bed Stand to Sit: 4: Min assist;With upper extremity assist;To chair/3-in-1 Details for Transfer Assistance: verbal cues for hand placement. Pt shaky with standing and pivot to chair     Exercise     Balance Balance Balance Assessed: Yes Static Sitting Balance Static Sitting - Level of Assistance: 5: Stand by assistance   End of Session OT - End of Session Equipment Utilized During Treatment: Gait belt;Rolling walker Activity Tolerance: Patient tolerated treatment well Patient left: in chair;with call bell/phone within reach;with family/visitor present  GO     Lennox Laity [phone] 01/11/2013, 10:28 AM

## 2013-01-11 NOTE — Progress Notes (Signed)
[facility] PROGRESS NOTE  ADITYA NASTASI YNW:[id number] DOB: Aug 05, 1929 DOA: 01/09/2013 PCP: Tomma Lightning, MD  Assessment/Plan: Dehydration  -Secondary to the patient's poor po intake and vomiting  -IV fluids  -LFTs and Lipase were normal  -Urinalysis did not show any pyuria   Question of SIRS  - started on empiric vancomycin, continue thru today - ruling out sepsis  - fever defervesced with treatment - blood cultures and urine cultures negative to date   Hyponatremia  -Likely secondary to profound volume depletion  -Continue IV fluids  -The patient has clinically hypovolemic  - monitor BMP   Nausea and vomiting  -Likely exacerbated by radiation esophagitis  -Continue Carafate  -Continue Protonix  -Anti-emetics  - symptoms improving  - advanced to regular diet by pt request today    Atrial fibrillation with RVR  -likely exacerbated by volume depletion  -Heart rate is improving with hydration  -The patient is not on any chronotropic medications at home  -TSH just slightly elevated, no adjustments to meds at this time given acute illness   Diabetes mellitus type 2  -Patient has been taking Lantus 20 units daily at home  -Will give half home dose given the patient's poor by mouth intake  -Hemoglobin A1c 9.6% indicating poor glycemic control  -Continue to monitor BS closely   Metastatic esophageal adenocarcinoma  -The patient has been placed on Dr. Kalman Drape consult list and saw patient this morning - Pt instructed to follow up with oncologist in 2 weeks  - abdominal pain/constipation  -2 view abdominal x-ray c/w large stool burden in colon  -providing laxatives   Chest pain  -symptoms resolved now -Low suspicion of pulmonary embolus as the patient is ready on Coumadin  -INR 1.96  -Cycled troponins negative for acute myocardial injury   Code Status: Full  Family Communication: wife at bedside  Disposition Plan: pending clinical  course  HPI/Subjective: Pt asking to advance to regular diet, reports that he is starting to feel better  Objective: Filed Vitals:   01/11/13 0627  BP: 128/88  Pulse: 103  Temp: 98.1 F (36.7 C)  Resp: 18    Intake/Output Summary (Last 24 hours) at 01/11/13 1345 Last data filed at 01/11/13 0900  Gross per 24 hour  Intake 2848.33 ml  Output    775 ml  Net 2073.33 ml   Filed Weights   01/09/13 0750 01/09/13 1245  Weight: 188 lb (85.276 kg) 187 lb 6.3 oz (85 kg)    Exam:  General: A&O x 3, NAD, nontoxic, pleasant/cooperative  Head/Eye: No conjunctival hemorrhage, no icterus, [place]/AT, No nystagmus  ENT: No icterus, No thrush, good dentition, no pharyngeal exudate, dry MM persist Neck: No masses, no lymphadenpathy, no bruits  CV: tachycardic, no murmur heard  Lung: CTAB, good air movement, no wheeze, no rhonchi  Abdomen: soft/mild epigastric discomfort without, +BS, nondistended, no peritoneal signs  Ext: No cyanosis, No rashes, No petechiae, No lymphangitis, trace LE edema  Data Reviewed: Basic Metabolic Panel:  Recent Labs Lab 01/09/13 0840 01/10/13 0415 01/11/13 0340  NA 125* 129* 127*  K 5.0 4.2 3.7  CL 94* 98 96  CO2 23 25 23   GLUCOSE 173* 91 150*  BUN 15 20 22   CREATININE 1.06 1.48* 1.58*  CALCIUM 8.4 8.5 8.4   Liver Function Tests:  Recent Labs Lab 01/09/13 0840 01/11/13 0340  AST 28 17  ALT 10 6  ALKPHOS 58 47  BILITOT 0.4 0.3  PROT 5.5* 4.8*  ALBUMIN 2.4* 1.9*  Recent Labs Lab 01/09/13 0840  LIPASE 15   No results found for this basename: AMMONIA,  in the last 168 hours CBC:  Recent Labs Lab 01/06/13 1349 01/09/13 0840 01/10/13 0415 01/11/13 0340  WBC 3.8* 4.8 4.3 3.2*  NEUTROABS 2.8 4.0  --  2.1  HGB 11.6* 11.7* 10.4* 10.4*  HCT 34.4* 33.1* 30.0* 30.0*  MCV 84.9 83.8 84.5 84.7  PLT 135* 147* 127* 112*   Cardiac Enzymes:  Recent Labs Lab 01/09/13 0840 01/09/13 1500 01/09/13 2026  TROPONINI <0.30 <0.30 <0.30   BNP  (last 3 results) No results found for this basename: PROBNP,  in the last 8760 hours CBG:  Recent Labs Lab 01/10/13 1206 01/10/13 1639 01/10/13 2120 01/11/13 0733 01/11/13 1203  GLUCAP 107* 102* 199* 117* 236*    Recent Results (from the past 240 hour(s))  URINE CULTURE     Status: None   Collection Time    01/09/13  9:04 AM      Result Value Range Status   Specimen Description URINE, CLEAN CATCH   Final   Special Requests NONE   Final   Culture  Setup Time     Final   Value: 01/09/2013 16:55     Performed at [facility]   Culture     Final   Value: INSIGNIFICANT GROWTH     Performed at [facility]   Report Status 01/10/2013 FINAL   Final  CULTURE, BLOOD (ROUTINE X 2)     Status: None   Collection Time    01/09/13  6:30 PM      Result Value Range Status   Specimen Description BLOOD RIGHT ARM   Final   Special Requests     Final   Value: BOTTLES DRAWN AEROBIC AND ANAEROBIC 5 CC BOTH BOTTLES   Culture  Setup Time     Final   Value: 01/10/2013 04:31     Performed at [facility]   Culture     Final   Value:        BLOOD CULTURE RECEIVED NO GROWTH TO DATE CULTURE WILL BE HELD FOR 5 DAYS BEFORE ISSUING A FINAL NEGATIVE REPORT     Performed at [facility]   Report Status PENDING   Incomplete  CULTURE, BLOOD (ROUTINE X 2)     Status: None   Collection Time    01/09/13  6:35 PM      Result Value Range Status   Specimen Description BLOOD RIGHT HAND   Final   Special Requests BOTTLES DRAWN AEROBIC ONLY 3CC   Final   Culture  Setup Time     Final   Value: 01/10/2013 04:31     Performed at [facility]   Culture     Final   Value:        BLOOD CULTURE RECEIVED NO GROWTH TO DATE CULTURE WILL BE HELD FOR 5 DAYS BEFORE ISSUING A FINAL NEGATIVE REPORT     Performed at [facility]   Report Status PENDING   Incomplete     Studies: No results found.  Scheduled Meds: . docusate sodium  100 mg Oral BID  . feeding supplement  (ENSURE)  1 Container Oral Q24H  . gabapentin  300 mg Oral QPM  . insulin aspart  0-9 Units Subcutaneous TID WC  . insulin glargine  10 Units Subcutaneous QHS  . lactose free nutrition  237 mL Oral Q24H  . levothyroxine  112 mcg Oral QAC  breakfast  . multivitamin with minerals  1 tablet Oral Daily  . ondansetron (ZOFRAN) IV  4 mg Intravenous Q6H  . pantoprazole  40 mg Oral Daily  . piperacillin-tazobactam (ZOSYN)  IV  3.375 g Intravenous Q8H  . senna  1 tablet Oral BID  . simvastatin  20 mg Oral QPM  . sodium chloride  3 mL Intravenous Q12H  . sucralfate  1 g Oral TID AC & HS  . vancomycin  750 mg Intravenous Q12H  . warfarin  0.5 mg Oral ONCE-1800  . Warfarin - Pharmacist Dosing Inpatient   Does not apply q1800   Continuous Infusions: . sodium chloride 100 mL/hr at 01/11/13 6578    Active Problems:   Atrial fibrillation with RVR   Hyponatremia   Dehydration   Esophageal adenocarcinoma  [facility]  [facility] Pager [phone]. If 7PM-7AM, please contact night-coverage at [email or web address], password [email or web address] 01/11/2013, 1:45 PM  LOS: 2 days

## 2013-01-12 ENCOUNTER — Ambulatory Visit: Payer: Medicare Other

## 2013-01-12 DIAGNOSIS — T66XXXS Radiation sickness, unspecified, sequela: Secondary | ICD-10-CM

## 2013-01-12 DIAGNOSIS — Y842 Radiological procedure and radiotherapy as the cause of abnormal reaction of the patient, or of later complication, without mention of misadventure at the time of the procedure: Secondary | ICD-10-CM

## 2013-01-12 DIAGNOSIS — D61810 Antineoplastic chemotherapy induced pancytopenia: Secondary | ICD-10-CM

## 2013-01-12 LAB — GLUCOSE, CAPILLARY
Glucose-Capillary: 129 mg/dL — ABNORMAL HIGH (ref 70–99)
Glucose-Capillary: 237 mg/dL — ABNORMAL HIGH (ref 70–99)
Glucose-Capillary: 204 mg/dL — ABNORMAL HIGH (ref 70–99)

## 2013-01-12 LAB — BASIC METABOLIC PANEL
CO2: 22 mEq/L (ref 19–32)
Calcium: 8.1 mg/dL — ABNORMAL LOW (ref 8.4–10.5)
Chloride: 99 mEq/L (ref 96–112)
GFR calc non Af Amer: 45 mL/min — ABNORMAL LOW (ref >90)
Glucose, Bld: 141 mg/dL — ABNORMAL HIGH (ref 70–99)
Sodium: 127 mEq/L — ABNORMAL LOW (ref 135–145)

## 2013-01-12 MED ORDER — METOPROLOL TARTRATE 12.5 MG HALF TABLET
12.5000 mg | ORAL_TABLET | Freq: Two times a day (BID) | ORAL | Status: DC
  Administered 2013-01-12 – 2013-01-13 (×3): 12.5 mg via ORAL
  Filled 2013-01-12 (×4): qty 1

## 2013-01-12 MED ORDER — BOOST PLUS PO LIQD
237.0000 mL | Freq: Three times a day (TID) | ORAL | Status: DC
  Administered 2013-01-12 – 2013-01-13 (×3): 237 mL via ORAL
  Filled 2013-01-12 (×5): qty 237

## 2013-01-12 MED ORDER — WARFARIN SODIUM 1 MG PO TABS
1.0000 mg | ORAL_TABLET | Freq: Once | ORAL | Status: AC
  Administered 2013-01-12: 1 mg via ORAL
  Filled 2013-01-12: qty 1

## 2013-01-12 MED ORDER — METOPROLOL TARTRATE 1 MG/ML IV SOLN
5.0000 mg | Freq: Four times a day (QID) | INTRAVENOUS | Status: DC | PRN
  Administered 2013-01-12: 5 mg via INTRAVENOUS
  Filled 2013-01-12: qty 5

## 2013-01-12 NOTE — Progress Notes (Signed)
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 The patient states that he has had odynophagia and increasing difficulty eating and drinking since the start of radiation therapy for his esophageal adenocarcinoma. He describes a burning in his chest which worsened in the past few days. Last evening, he experienced dry heaves. He checked his blood pressure with his automated machine at home. He states that his heart rate was in the 130s with systolic blood pressure in the 130s. Because of his decreased by mouth intake and worsening weakness, he came to the emergency department for further evaluation. His last radiation therapy was on 01/05/2013. His last chemotherapy was 2 weeks ago. He complained of some chest discomfort as well as shortness of breath last night. He states that his breathing has improved. He also had some dizziness without any syncope or falls. He denied any abdominal pain, dysuria, hematuria, headache, visual disturbance, hematochezia, melena, hemoptysis, coughing. His temperature at home was 100.55F.   PT Comments   Pt participatory, RN requested limited activity due to to increased HR.  Follow Up Recommendations  SNF     Does the patient have the potential to tolerate intense rehabilitation     Barriers to Discharge        Equipment Recommendations  None recommended by PT    Recommendations for Other Services    Frequency Min 3X/week   Progress towards PT Goals Progress towards PT goals: Progressing toward goals  Plan Current  plan remains appropriate    Precautions / Restrictions     Pertinent Vitals/Pain HR 102    Mobility  Bed Mobility Bed Mobility: Supine to Sit Supine to Sit: 4: Min assist;With rails;HOB flat Details for Bed Mobility Assistance: assist for trunk Transfers Sit to Stand: 4: Min assist;With upper extremity assist;From bed Stand to Sit: 4: Min assist;With upper extremity assist;To chair/3-in-1 Stand Pivot Transfers: 3: Mod assist Details for Transfer Assistance: verbal cues for hand placement. Pt shaky with standing and pivot to chair Ambulation/Gait Ambulation/Gait Assistance: Not tested (comment) Gait velocity: pt's HR had incr. to 140 after ambulating to BR withnursing earlier.    Exercises     PT Diagnosis:    PT Problem List:   PT Treatment Interventions:     PT Goals (current goals can now be found in the care plan section)    Visit Information  Last PT Received On: 01/12/13 Assistance Needed: +2 History of Present Illness: 79 year old male with a history of 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 The patient states that he has had odynophagia and increasing difficulty eating and drinking since the start of radiation therapy for his esophageal adenocarcinoma. He describes a burning in his chest which worsened in the past few days. Last evening, he experienced dry heaves. He checked his blood pressure with his automated machine at home. He states that his heart rate was in the 130s with systolic blood pressure in the 130s. Because of his decreased by mouth intake and worsening weakness, he came to the emergency department for  further evaluation. His last radiation therapy was on 01/05/2013. His last chemotherapy was 2 weeks ago. He complained of some chest discomfort as well as shortness of breath last  night. He states that his breathing has improved. He also had some dizziness without any syncope or falls. He denied any abdominal pain, dysuria, hematuria, headache, visual disturbance, hematochezia, melena, hemoptysis, coughing. His temperature at home was 100.81F.    Subjective Data      Cognition  Cognition Arousal/Alertness: Awake/alert Behavior During Therapy: WFL for tasks assessed/performed Overall Cognitive Status: Within Functional Limits for tasks assessed    Balance     End of Session PT - End of Session Activity Tolerance: Treatment limited secondary to medical complications (Comment);Patient limited by fatigue Patient left: in chair;with call bell/phone within reach;with family/visitor present;with chair alarm set Nurse Communication: Mobility status   GP     Rada Hay 01/12/2013, 4:38 PM Blanchard Kelch PT [phone]

## 2013-01-12 NOTE — Progress Notes (Signed)
ANTICOAGULATION CONSULT NOTE - Follow Up Consult  Pharmacy Consult for Warfarin Indication: atrial fibrillation  No Known Allergies  Patient Measurements: Height: 6' (182.9 cm) Weight: 187 lb 6.3 oz (85 kg) IBW/kg (Calculated) : 77.6  Vital Signs: Temp: 98.3 F (36.8 C) (11/19 0853) Temp src: Oral (11/19 0853) BP: 109/66 mmHg (11/19 0853) Pulse Rate: 107 (11/19 0853)  Labs:  Recent Labs  01/09/13 1500 01/09/13 2026 01/10/13 0415 01/11/13 0340 01/12/13 0430  HGB  --   --  10.4* 10.4*  --   HCT  --   --  30.0* 30.0*  --   PLT  --   --  127* 112*  --   LABPROT  --   --  21.7* 29.0* 28.1*  INR  --   --  1.96* 2.86* 2.74*  CREATININE  --   --  1.48* 1.58* 1.42*  TROPONINI <0.30 <0.30  --   --   --     Estimated Creatinine Clearance: 44.8 ml/min (by C-G formula based on Cr of 1.42).   Assessment: 79 yo male with metastatic esophageal cancer presented to ER with CC nausea, vomiting and fatigue. Pharmacy ordered to continue warfarin dosing for patient's history of afib. Note that patient reportedly takes 7.5mg  daily except 5mg  MonWedFri with last dose 11/13 and patient was instructed to hold doses because of an INR of 4.62 until INR rechecked on Monday 11/17.  INR 2.74, increased quickly yesterday (1.96 > 2.86) on home dose so being cautious with smaller doses now.  INR decreased today following 0.5 mg dose last night.  Hbg slightly decreased, Plts falling, no bleeding reported.  No CBC today, ordered for tomorrow AM.  Likely more sensitive to warfarin currently with decrease PO intake - advanced to carb modified diet yesterday with some PO intake recorded  No drug-drug interactions noted that would be responsible for significant increase in INR  Goal of Therapy:  INR 2-3   Plan:   Warfarin 1mg  today.  Continue with another small dose today to ensure INR trending back down before resuming higher doses.  Daily PT/INR.  Clance Boll, PharmD, BCPS Pager:  [phone] 01/12/2013 12:18 PM

## 2013-01-12 NOTE — Progress Notes (Signed)
IP PROGRESS NOTE  Subjective:   He continues to have some xiphoid pain and nausea.  Objective: Vital signs in last 24 hours: Blood pressure 109/66, pulse 107, temperature 98.3 F (36.8 C), temperature source Oral, resp. rate 18, height 6' (1.829 m), weight 187 lb 6.3 oz (85 kg), SpO2 94.00%.  Intake/Output from previous day: 11/18 0701 - 11/19 0700 In: 3420 [P.O.:720; I.V.:2300; IV Piggyback:400] Out: 950 [Urine:950]  Physical Exam:  HEENT: No thrush Lungs: Decreased breath sounds at the lower chest bilaterally, no respiratory distress Cardiac: Irregular Abdomen: No hepatomegaly, nontender Extremities: No leg edema     Lab Results:  Recent Labs  01/10/13 0415 01/11/13 0340  WBC 4.3 3.2*  HGB 10.4* 10.4*  HCT 30.0* 30.0*  PLT 127* 112*   ANC 2.1 on 01/11/2013 BMET  Recent Labs  01/11/13 0340 01/12/13 0430  NA 127* 127*  K 3.7 3.7  CL 96 99  CO2 23 22  GLUCOSE 150* 141*  BUN 22 18  CREATININE 1.58* 1.42*  CALCIUM 8.4 8.1*    Studies/Results: No results found.  Medications: I have reviewed the patient's current medications.  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  Initiation of radiation and weekly Taxol/carboplatin 12/06/2012.  Radiation completed 01/05/2013 2. Indeterminate right hepatic mass on ultrasound-10/20/2012/MRI-10/29/2012 , hypermetabolic on the PET scan 12/02/2012. 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  3. Dysphagia/subxiphoid pain secondary to #1.  4. History of atrial fibrillation-maintained on Coumadin.  5. Renal insufficiency.  6. History of a CVA.  7. 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  8. Diabetes.  9.  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 Fever 01/09/2013-? Related to esophagitis, no apparent source for infection, cultures are negative, no significant fever over the past few days 11. Mild leukopenia/thrombocytopenia secondary to chemotherapy and radiation   He continues to have symptoms related to radiation esophagitis. Hopefully the pain will improve over the next few days.   Recommendations:  1. Continue symptomatic therapy for the radiation esophagitis.  2. advance diet as tolerated  3. [facility] to discuss treatment of the metastatic esophagus cancer.   I appreciate the care from the hospitalist service. Please call oncology as needed.    LOS: 3 days   Toneisha Savary  01/12/2013, 1:17 PM

## 2013-01-12 NOTE — Progress Notes (Signed)
[facility] PROGRESS NOTE  RIVAAN KENDALL EAV:[id number] DOB: 1929-05-23 DOA: 01/09/2013 PCP: Tomma Lightning, MD  Assessment/Plan  Dehydration, has received three days of IVF and now has some rales at the bases.  Lipase and LFTs were wnl and UA neg.   -  D/c IVF  SIRS with fever but unidentified source of infection.   -  Blood cultures NGTD -  CXR neg -  Urine cx neg -  D/c antibiotics   Radiation esophagatitis with nausea and vomiting, improving, but not eating very much -  Continue protonix and carafate -  Strict I/O -  Add additional supplements  Hyponatremia, moderate and asymptomatic.  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  Follow up with Dr. Truett Perna in 2 weeks. -  Please continue conversation about code status and goals of care  Constipation, resolving.  Had BM this am  Chest pain, resolved and was likely related to esophagitis.  troponins were negative.    Pancytopenia, worsening leukopenia and thrombocytopenia -  Repeat CBC in AM  CKD stage 3, BUN trending down and creatinine stable.   - MInimize nephrotoxins and renally dose medications  Diet:  regular Access:  PIV IVF:  off Proph:  coumadin  Code Status: full Family Communication: spoke with patient and his wife Disposition Plan: PT/OT recommending SNF, however, family planning to take home.  Will order home health supplies.  If drinking improving and HR stable, possibly home tomorrow.     Consultants:  Dr. Truett Perna, Oncology  Procedures:  XR abd  Antibiotics:   zosyn 11/16 >> 11/19  Vancomycin 11/16 >> 11/19  HPI/Subjective:  Feels weak.  Wife assisted him up to bedside commode.  He had a BM.  States he thinks his HR  has been persistently elevated for a while.    Objective: Filed Vitals:   01/11/13 1356 01/11/13 2300 01/12/13 0500 01/12/13 0853  BP: 110/60 121/84 141/77 109/66  Pulse: 99 98 119 107  Temp: 98 F (36.7 C) 99.2 F (37.3 C) 99.1 F (37.3 C) 98.3 F (36.8 C)  TempSrc: Oral Oral Oral Oral  Resp: 20 18 16 18   Height:      Weight:      SpO2: 95% 100% 95% 94%    Intake/Output Summary (Last 24 hours) at 01/12/13 1011 Last data filed at 01/12/13 0900  Gross per 24 hour  Intake   3417 ml  Output    951 ml  Net   2466 ml   Filed Weights   01/09/13 0750 01/09/13 1245  Weight: 85.276 kg (188 lb) 85 kg (187 lb 6.3 oz)    Exam:   General:  CM, No acute distress  HEENT:  NCAT, MMM  Cardiovascular:  IRRR and tachycardic, nl S1, S2 no mrg, 2+ pulses, warm extremities  Respiratory:  Rales at the bilateral bases, no wheezes or rhonchi, no increased WOB  Abdomen:   NABS, soft, NT/ND, full feeling  MSK:   Normal tone and bulk, no LEE  Neuro:  Grossly intact  Data Reviewed: Basic Metabolic Panel:  Recent Labs Lab 01/09/13 0840 01/10/13 0415 01/11/13 0340 01/12/13 0430  NA 125* 129* 127* 127*  K 5.0 4.2 3.7 3.7  CL  94* 98 96 99  CO2 23 25 23 22   GLUCOSE 173* 91 150* 141*  BUN 15 20 22 18   CREATININE 1.06 1.48* 1.58* 1.42*  CALCIUM 8.4 8.5 8.4 8.1*   Liver Function Tests:  Recent Labs Lab 01/09/13 0840 01/11/13 0340  AST 28 17  ALT 10 6  ALKPHOS 58 47  BILITOT 0.4 0.3  PROT 5.5* 4.8*  ALBUMIN 2.4* 1.9*    Recent Labs Lab 01/09/13 0840  LIPASE 15   No results found for this basename: AMMONIA,  in the last 168 hours CBC:  Recent Labs Lab 01/06/13 1349 01/09/13 0840 01/10/13 0415 01/11/13 0340  WBC 3.8* 4.8 4.3 3.2*  NEUTROABS 2.8 4.0  --  2.1  HGB 11.6* 11.7* 10.4* 10.4*  HCT 34.4* 33.1* 30.0* 30.0*  MCV 84.9 83.8 84.5 84.7  PLT 135* 147* 127* 112*   Cardiac Enzymes:  Recent Labs Lab 01/09/13 0840 01/09/13 1500 01/09/13 2026   TROPONINI <0.30 <0.30 <0.30   BNP (last 3 results) No results found for this basename: PROBNP,  in the last 8760 hours CBG:  Recent Labs Lab 01/11/13 0733 01/11/13 1203 01/11/13 1706 01/11/13 2306 01/12/13 0747  GLUCAP 117* 236* 256* 171* 129*    Recent Results (from the past 240 hour(s))  URINE CULTURE     Status: None   Collection Time    01/09/13  9:04 AM      Result Value Range Status   Specimen Description URINE, CLEAN CATCH   Final   Special Requests NONE   Final   Culture  Setup Time     Final   Value: 01/09/2013 16:55     Performed at [facility]   Culture     Final   Value: INSIGNIFICANT GROWTH     Performed at [facility]   Report Status 01/10/2013 FINAL   Final  CULTURE, BLOOD (ROUTINE X 2)     Status: None   Collection Time    01/09/13  6:30 PM      Result Value Range Status   Specimen Description BLOOD RIGHT ARM   Final   Special Requests     Final   Value: BOTTLES DRAWN AEROBIC AND ANAEROBIC 5 CC BOTH BOTTLES   Culture  Setup Time     Final   Value: 01/10/2013 04:31     Performed at [facility]   Culture     Final   Value:        BLOOD CULTURE RECEIVED NO GROWTH TO DATE CULTURE WILL BE HELD FOR 5 DAYS BEFORE ISSUING A FINAL NEGATIVE REPORT     Performed at [facility]   Report Status PENDING   Incomplete  CULTURE, BLOOD (ROUTINE X 2)     Status: None   Collection Time    01/09/13  6:35 PM      Result Value Range Status   Specimen Description BLOOD RIGHT HAND   Final   Special Requests BOTTLES DRAWN AEROBIC ONLY 3CC   Final   Culture  Setup Time     Final   Value: 01/10/2013 04:31     Performed at [facility]   Culture     Final   Value:        BLOOD CULTURE RECEIVED NO GROWTH TO DATE CULTURE WILL BE HELD FOR 5 DAYS BEFORE ISSUING A FINAL NEGATIVE REPORT     Performed at [facility]   Report Status PENDING   Incomplete  Studies: No results found.  Scheduled Meds: . docusate sodium   100 mg Oral BID  . feeding supplement (ENSURE)  1 Container Oral Q24H  . gabapentin  300 mg Oral QPM  . insulin aspart  0-9 Units Subcutaneous TID WC  . insulin glargine  12 Units Subcutaneous QHS  . lactose free nutrition  237 mL Oral Q24H  . levothyroxine  112 mcg Oral QAC breakfast  . metoprolol tartrate  12.5 mg Oral BID  . multivitamin with minerals  1 tablet Oral Daily  . ondansetron (ZOFRAN) IV  4 mg Intravenous Q6H  . pantoprazole  40 mg Oral Daily  . piperacillin-tazobactam (ZOSYN)  IV  3.375 g Intravenous Q8H  . senna  1 tablet Oral BID  . simvastatin  20 mg Oral QPM  . sodium chloride  3 mL Intravenous Q12H  . sucralfate  1 g Oral TID AC & HS  . vancomycin  1,250 mg Intravenous Q24H  . Warfarin - Pharmacist Dosing Inpatient   Does not apply q1800   Continuous Infusions: . sodium chloride 100 mL/hr at 01/12/13 0457    Active Problems:   Atrial fibrillation with RVR   Hyponatremia   Dehydration   Esophageal adenocarcinoma    Time spent: 30 min    Bruno Leach, [facility]  [facility] Pager [phone]. If 7PM-7AM, please contact night-coverage at [email or web address], password [facility] 01/12/2013, 10:11 AM  LOS: 3 days

## 2013-01-13 ENCOUNTER — Non-Acute Institutional Stay (SKILLED_NURSING_FACILITY): Payer: Medicare Other | Admitting: Nurse Practitioner

## 2013-01-13 ENCOUNTER — Ambulatory Visit: Payer: Medicare Other

## 2013-01-13 DIAGNOSIS — D61810 Antineoplastic chemotherapy induced pancytopenia: Secondary | ICD-10-CM

## 2013-01-13 DIAGNOSIS — R634 Abnormal weight loss: Secondary | ICD-10-CM

## 2013-01-13 DIAGNOSIS — C159 Malignant neoplasm of esophagus, unspecified: Secondary | ICD-10-CM

## 2013-01-13 DIAGNOSIS — R112 Nausea with vomiting, unspecified: Secondary | ICD-10-CM

## 2013-01-13 DIAGNOSIS — C787 Secondary malignant neoplasm of liver and intrahepatic bile duct: Secondary | ICD-10-CM

## 2013-01-13 DIAGNOSIS — C801 Malignant (primary) neoplasm, unspecified: Secondary | ICD-10-CM

## 2013-01-13 DIAGNOSIS — T451X5A Adverse effect of antineoplastic and immunosuppressive drugs, initial encounter: Secondary | ICD-10-CM

## 2013-01-13 DIAGNOSIS — R630 Anorexia: Secondary | ICD-10-CM

## 2013-01-13 LAB — CBC
HCT: 32.1 % — ABNORMAL LOW (ref 39.0–52.0)
MCH: 29.6 pg (ref 26.0–34.0)
MCHC: 34.9 g/dL (ref 30.0–36.0)
Platelets: 133 10*3/uL — ABNORMAL LOW (ref 150–400)
RDW: 15.8 % — ABNORMAL HIGH (ref 11.5–15.5)
WBC: 2.8 10*3/uL — ABNORMAL LOW (ref 4.0–10.5)

## 2013-01-13 LAB — PROTIME-INR
INR: 1.58 — ABNORMAL HIGH (ref 0.00–1.49)
Prothrombin Time: 18.4 seconds — ABNORMAL HIGH (ref 11.6–15.2)

## 2013-01-13 LAB — GLUCOSE, CAPILLARY
Glucose-Capillary: 138 mg/dL — ABNORMAL HIGH (ref 70–99)
Glucose-Capillary: 169 mg/dL — ABNORMAL HIGH (ref 70–99)

## 2013-01-13 MED ORDER — DSS 100 MG PO CAPS: 100.0000 mg | ORAL_CAPSULE | Freq: Two times a day (BID) | ORAL | Status: DC

## 2013-01-13 MED ORDER — SUCRALFATE 1 GM/10ML PO SUSP
1.0000 g | Freq: Three times a day (TID) | ORAL | Status: DC
  Administered 2013-01-13 (×2): 1 g via ORAL
  Filled 2013-01-13 (×5): qty 10

## 2013-01-13 MED ORDER — INSULIN GLARGINE 100 UNIT/ML ~~LOC~~ SOLN: 12.0000 [IU] | Freq: Every day | SUBCUTANEOUS | Status: DC

## 2013-01-13 MED ORDER — OMEPRAZOLE 20 MG PO CPDR: 20.0000 mg | DELAYED_RELEASE_CAPSULE | Freq: Two times a day (BID) | ORAL | Status: AC

## 2013-01-13 MED ORDER — METOPROLOL TARTRATE 12.5 MG HALF TABLET: 12.5000 mg | ORAL_TABLET | Freq: Two times a day (BID) | ORAL | Status: DC

## 2013-01-13 MED ORDER — ADULT MULTIVITAMIN W/MINERALS CH: 1.0000 | ORAL_TABLET | Freq: Every day | ORAL | Status: DC

## 2013-01-13 MED ORDER — BOOST PLUS PO LIQD: 237.0000 mL | Freq: Two times a day (BID) | ORAL | Status: DC

## 2013-01-13 MED ORDER — PANTOPRAZOLE SODIUM 40 MG PO TBEC
40.0000 mg | DELAYED_RELEASE_TABLET | Freq: Two times a day (BID) | ORAL | Status: DC
  Administered 2013-01-13: 10:00:00 40 mg via ORAL

## 2013-01-13 MED ORDER — SENNA 8.6 MG PO TABS: 1.0000 | ORAL_TABLET | Freq: Two times a day (BID) | ORAL | Status: DC

## 2013-01-13 MED ORDER — ENSURE PUDDING PO PUDG: 1.0000 | ORAL | Status: DC

## 2013-01-13 MED ORDER — ALPRAZOLAM 0.25 MG PO TABS: 0.2500 mg | ORAL_TABLET | Freq: Every evening | ORAL | Status: DC | PRN

## 2013-01-13 MED ORDER — HYDROCODONE-ACETAMINOPHEN 5-325 MG PO TABS: 0.5000 | ORAL_TABLET | ORAL | Status: DC | PRN

## 2013-01-13 MED ORDER — MAGIC MOUTHWASH W/LIDOCAINE: 5.0000 mL | Freq: Three times a day (TID) | ORAL | Status: DC | PRN

## 2013-01-13 MED ORDER — WARFARIN SODIUM 7.5 MG PO TABS
7.5000 mg | ORAL_TABLET | Freq: Once | ORAL | Status: DC
  Filled 2013-01-13: qty 1

## 2013-01-13 NOTE — Progress Notes (Signed)
  Radiation Oncology         [phone] ________________________________  Name: Luis Mclean MRN: [id number]  Date: 12/10/2012  DOB: Jan 12, 1930  SIMULATION AND TREATMENT PLANNING NOTE  DIAGNOSIS:  Esophageal cancer  NARRATIVE:  The patient underwent simulation for additional treatment planning. The patient initially has been planned to receive 5 fractions using a 2 field technique. He then will begin a IMRT   technique which is medically necessary to treat the involved area while adequately sparing critical nearby normal structures. This treatment will be carried out on our tomotherapy unit with daily image guidance. This treatment is necessary given the complexity of the patient's target region.   The CT images were loaded into the planning software.  Then the target and avoidance structures were contoured.  Treatment planning then occurred.  The radiation prescription was entered and confirmed.   I have  therefore requested : Intensity Modulated Radiotherapy (IMRT) is medically necessary for this case for the following reason:  Complexity of the target in relation to the lungs, spinal cord, heart.   PLAN:  The patient will receive 41.4 Gy in  23  fractions.  ________________________________   Radene Gunning, MD, PhD

## 2013-01-13 NOTE — Progress Notes (Signed)
  Radiation Oncology         [phone] ________________________________  Name: Luis Mclean MRN: [id number]  Date: 12/22/2012  DOB: February 18, 1930  SIMULATION AND TREATMENT PLANNING NOTE  DIAGNOSIS:  Esophageal cancer  NARRATIVE:  The patient has been found to have metastatic disease. He therefore will have his planned change to a hypo-fractionated course for the remainder of his treatment. We therefore will plan for the patient to receive an additional 22 gray in 10 fractions at 2.2 gray per fraction. This will begin after his initial 13 fractions which will yield 31 gray. The patient's treatment plan otherwise will remain the same on our tomotherapy unit with daily image guidance.   PLAN:  The patient will receive 45.4 Gy in 23 fractions.  ________________________________   Radene Gunning, MD, PhD

## 2013-01-13 NOTE — Progress Notes (Signed)
Patient is now agreeable to SNF placement for rehab & has accepted bed offer at [facility]. Awaiting [employer] insurance authorization - patient & wife aware. Wife to transport to facility.   CSW assisted patient/family in completing Advance Directives. Patient completed healthcare POA & living will. Clinical Social Worker notarized documents and made copies for patient/family. Clinical Social Worker placed copy on shadow chart to be scanned into patient's chart.   Clinical Social Work Department CLINICAL SOCIAL WORK PLACEMENT NOTE 01/13/2013  Patient:  CODEN, FRANCHI  Account Number:  [id number] Admit date:  01/09/2013  Clinical Social Worker:  Orpah Greek  Date/time:  01/11/2013 11:54 AM  Clinical Social Work is seeking post-discharge placement for this patient at the following level of care:   SKILLED NURSING   (*CSW will update this form in Epic as items are completed)   01/11/2013  Patient/family provided with [facility] Department of Clinical Social Work's list of facilities offering this level of care within the geographic area requested by the patient (or if unable, by the patient's family).  01/11/2013  Patient/family informed of their freedom to choose among providers that offer the needed level of care, that participate in Medicare, Medicaid or managed care program needed by the patient, have an available bed and are willing to accept the patient.  01/11/2013  Patient/family informed of [facility]' ownership interest in [facility], as well as of the fact that they are under no obligation to receive care at this facility.  PASARR submitted to EDS on 01/11/2013 PASARR number received from EDS on 01/11/2013  FL2 transmitted to all facilities in geographic area requested by pt/family on  01/11/2013 FL2 transmitted to all facilities within larger geographic area on   Patient informed that his/her managed care company has contracts with or will  negotiate with  certain facilities, including the following:   [facility]     Patient/family informed of bed offers received:  01/13/2013 Patient chooses bed at [facility] Physician recommends and patient chooses bed at    Patient to be transferred to [facility] on  01/13/2013 Patient to be transferred to facility by wife's car  The following physician request were entered in Epic:   Additional Comments:   Unice Bailey, LCSW [facility] Clinical Social Worker cell #: [phone]

## 2013-01-13 NOTE — Progress Notes (Addendum)
  Radiation Oncology         [phone] ________________________________  Name: Luis Mclean MRN: [id number]  Date: 11/26/2012  DOB: 1929/03/19  SIMULATION AND TREATMENT PLANNING NOTE  DIAGNOSIS:  Esophageal cancer  NARRATIVE:  The patient was brought to the CT Simulation planning suite.  Identity was confirmed.  All relevant records and images related to the planned course of therapy were reviewed.   Written consent to proceed with treatment was confirmed which was freely given after reviewing the details related to the planned course of therapy had been reviewed with the patient.  Then, the patient was set-up in a stable reproducible  supine position for radiation therapy.  CT images were obtained.  Surface markings were placed.   a customized VAC lock bag was constructed to help with patient immobilization and this complex treatment device will be used daily.  The CT images were loaded into the planning software.  Then the target and avoidance structures were contoured.  Treatment planning then occurred.  The radiation prescription was entered and confirmed.  A total of 2 complex treatment devices were fabricated which relate to the designed radiation treatment fields. Each of these customized fields/ complex treatment devices will be used on a daily basis during the radiation course. I have requested : Isodose Plan.  The patient will undergo daily image guidance to ensure accurate localization of the target, and adequate minimize dose to the normal surrounding structures in close proximity to the target.   PLAN:  The patient will receive 9 Gy in 5 fractions. It is anticipated that the patient will then begin a IMRT   technique for the remainder of his treatment which will deliver a total of 50.4 gray including the patient's first 5 fractions. \    Special treatment procedure The patient will receive chemotherapy during the course of radiation treatment. The patient may experience  increased or overlapping toxicity due to this combined-modality approach and the patient will be monitored for such problems. This may include extra lab work as necessary. This therefore constitutes a special treatment procedure.    ________________________________   Radene Gunning, MD, PhD

## 2013-01-13 NOTE — Addendum Note (Signed)
Encounter addended by: Jonna Coup, MD on: 01/13/2013  5:46 PM<BR>     Documentation filed: Notes Section

## 2013-01-13 NOTE — Progress Notes (Signed)
ANTICOAGULATION CONSULT NOTE - Follow Up Consult  Pharmacy Consult for Warfarin Indication: atrial fibrillation  No Known Allergies  Patient Measurements: Height: 6' (182.9 cm) Weight: 187 lb 6.3 oz (85 kg) IBW/kg (Calculated) : 77.6  Vital Signs: Temp: 98.4 F (36.9 C) (11/20 0535) Temp src: Oral (11/20 0535) BP: 160/99 mmHg (11/20 0535) Pulse Rate: 88 (11/20 0535)  Labs:  Recent Labs  01/11/13 0340 01/12/13 0430 01/13/13 0446  HGB 10.4*  --  11.2*  HCT 30.0*  --  32.1*  PLT 112*  --  133*  LABPROT 29.0* 28.1* 18.4*  INR 2.86* 2.74* 1.58*  CREATININE 1.58* 1.42*  --     Estimated Creatinine Clearance: 44.8 ml/min (by C-G formula based on Cr of 1.42).   Assessment: 79 yo male with metastatic esophageal cancer presented to ER with CC nausea, vomiting and fatigue. Pharmacy ordered to continue warfarin dosing for patient's history of afib. Note that patient reportedly takes 7.5mg  daily except 5mg  MonWedFri with last dose 11/13 and patient was instructed to hold doses because of an INR of 4.62 until INR rechecked on Monday 11/17.  INR subtherapeutic now at 1.58.  INR increased quickly (1.96 > 2.86) on admission on home dose so was being cautious with smaller doses.  Pt may have been more sensitive to warfarin d/t decreased PO intake.  Appears to be eating carb modified diet now.  Will resume home warfarin dose today.  Hbg and plts low but both improved today.  No bleeding reported.    No drug-drug interactions noted that would have been responsible for significant increase in INR  Goal of Therapy:  INR 2-3   Plan:   Warfarin 7.5mg  today.    Daily PT/INR.  Clance Boll, PharmD, BCPS Pager: [phone] 01/13/2013 8:19 AM

## 2013-01-13 NOTE — Discharge Summary (Addendum)
Physician Discharge Summary  Luis Mclean NGE:[id number] DOB: May 04, 1929 DOA: 01/09/2013  PCP: Tomma Lightning, MD  Admit date: 01/09/2013 Discharge date: 01/13/2013  Recommendations for Outpatient Follow-up:  1. To SNF for ongoing physical, occupational and speech therapy 2. Follow up appointment scheduled with Dr. Truett Perna in approximately 2 weeks.  Please assist with transportation if family indicates this is needed.  Repeat BMP and CBC in 1 week.  Please follow up final reports of blood cultures which are pending at the time of discharge.   3. INR check on Monday, results to be sent to PCP, Tomma Lightning.  Phone [phone].  Fax [phone] 4. Started metoprolol and reduced insulin doses.   5. Repeat TFTs in 4 weeks.    Discharge Diagnoses:  Active Problems:   Atrial fibrillation with RVR   Hyponatremia   Dehydration   Esophageal adenocarcinoma   Antineoplastic chemotherapy induced pancytopenia   Chronic kidney disease, stage 3   Discharge Condition: stable, improved  Diet recommendation: diabetic diet with thin liquids and supplements  Wt Readings from Last 3 Encounters:  01/09/13 85 kg (187 lb 6.3 oz)  01/06/13 85.594 kg (188 lb 11.2 oz)  01/03/13 85.367 kg (188 lb 3.2 oz)    History of present illness:   79 year old male with a history of 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 The patient states that he has had odynophagia and increasing difficulty eating and drinking since the start of radiation therapy for his esophageal adenocarcinoma. He describes a burning in his chest which worsened in the past few days. Last evening, he experienced dry heaves. He checked his blood pressure with his automated machine at home. He states that his heart rate was in the 130s with systolic blood  pressure in the 130s. Because of his decreased by mouth intake and worsening weakness, he came to the emergency department for further evaluation. His last radiation therapy was on 01/05/2013. His last chemotherapy was 2 weeks ago. He complained of some chest discomfort as well as shortness of breath last night. He states that his breathing has improved. He also had some dizziness without any syncope or falls. He denied any abdominal pain, dysuria, hematuria, headache, visual disturbance, hematochezia, melena, hemoptysis, coughing. His temperature at home was 100.81F.   In the emergency department, the patient was noted to have an initial heart rate in the 130s. He remained hemodynamically stable. The patient was started on intravenous fluids and given morphine and Zofran. BMP showed sodium 125, potassium 5.0. CBC showed WBC 4.8, hemoglobin 11.7, platelets 147,000. Troponin was negative. Lactic acid was 1.0. Urinalysis was negative for pyuria. EKG showed atrial fibrillation with nonspecific T-wave flattening. Chest x-ray was negative for any infiltrates.  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  His troponins were negative and telemetry demonstrated atrial fibrillation.  ECG without evidence of acute ischemia.  He was dehydrated at admission.  He was given gentle hydration for three days.  His PPI was increased and he continued carafate prior to meals, which helped.  He was able to eat and drink enough to maintain hydration (1L/day) prior to discharge.    Fever with SIRS without identified source of infection.  His blood cultures remained NGTD.  CXR was negative, urine culture was negative.  He did not have signs of ulcers or thrush on  exam to suggest infectious esophagitis and his temperature trended down.  He was started on empiric vancomycin and zosyn, however, these were discontinued once his cultures were negative after 48  hours.  If his fevers return, recommend further investigation into infectious esophagitis in addition to traditional evaluation.   Hyponatremia, moderate and asymptomatic. 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  Repeat as outpatient.    A-fib with RVR, has been going on at home for a while with resting HR in the 110s per wife.  Started metoprolol 12.5mg  BID and resting heart rate trended down to the 80s-90s and rose to the 100-110s only with exertion.  He continued anticoagulation.    Sick euthyroid,  Lab Results  Component Value Date   TSH 7.261* 01/09/2013   repeat TFTs as outpatient in 4 weeks.  Metastatic esophageal adenocarcinoma. Follow up with Dr. Truett Perna in 2 weeks arranged.  Please continue conversation about code status and goals of care.  Constipation, resolved with stool softeners.  Pancytopenia due to antineoplastic agents.  WBC trending down, but hemoglobin stable, and platelets starting to trend up.  Repeat CBC by Dr. Truett Perna in two weeks.   CKD stage 3, BUN trending down and creatinine stable.   Consultants:  Dr. Truett Perna, Oncology Procedures:  XR abd Antibiotics:  zosyn 11/16 >> 11/19  Vancomycin 11/16 >> 11/19  Discharge Exam: Filed Vitals:   01/13/13 1026  BP: 132/93  Pulse: 98  Temp:   Resp:    Filed Vitals:   01/12/13 2045 01/13/13 0535 01/13/13 0900 01/13/13 1026  BP: 113/66 160/99 138/90 132/93  Pulse: 93 88 85 98  Temp: 98.1 F (36.7 C) 98.4 F (36.9 C) 96.8 F (36 C)   TempSrc: Oral Oral Oral   Resp: 18 18 18    Height:      Weight:      SpO2: 96% 96% 98%     General: CM, No acute distress  HEENT: NCAT, MMM  Cardiovascular: IRRR and tachycardic, nl S1, S2 no mrg, 2+ pulses, warm extremities  Respiratory: Rales at the bilateral bases, no wheezes or rhonchi, no increased WOB  Abdomen: NABS, soft, NT/ND MSK: Normal tone and bulk, trace LEE  Neuro: Grossly intact   Discharge Instructions      Discharge Orders    Future Appointments Provider Department Dept Phone   01/25/2013 11:00 AM Delcie Roch [facility] [phone]   01/25/2013 2:30 PM [facility] [phone]   Patient to arrive 15 minutes prior to appointment time. Patient to pick up oral contrast at least 1 day prior to exam, unless otherwise instructed by your physician. No solid food 4 hours prior to exam. Liquids and Medicines are okay.   01/25/2013 2:45 PM Rana Snare, NP [facility] MEDICAL ONCOLOGY [phone]   02/10/2013 11:45 AM Jonna Coup, MD [facility] RAD[facility]ATION ONCOLOGY [phone]   Future Orders Complete By Expires   Call MD for:  difficulty breathing, headache or visual disturbances  As directed    Call MD for:  extreme fatigue  As directed    Call MD for:  hives  As directed    Call MD for:  persistant dizziness or light-headedness  As directed    Call MD for:  persistant nausea and vomiting  As directed    Call MD for:  severe uncontrolled pain  As directed    Call MD for:  temperature >  100.4  As directed    Diet Carb Modified  As directed    Discharge instructions  As directed    Comments:     Luis Mclean was hospitalized with chest pain which was likely due to radiation esophagitis.  He should increase his omeprazole and continue his carafate.  He may try magic mouthwash with lidocaine if he continues to have breakthrough pain.  He will follow up Dr. Truett Perna in two weeks.  He was started on metoprolol for his atrial fibrillation and is continuing his warfarin.   Increase activity slowly  As directed        Medication List         ALPRAZolam 0.25 MG tablet  Commonly known as:  XANAX  Take 1 tablet (0.25 mg total) by mouth at bedtime as needed for sleep.     diphenhydrAMINE 25 MG tablet  Commonly known as:  SOMINEX  Take 25 mg by mouth at bedtime as needed for sleep.     DSS 100 MG Caps  Take 100 mg by  mouth 2 (two) times daily.     feeding supplement (ENSURE) Pudg  Take 1 Container by mouth daily.     lactose free nutrition Liqd  Take 237 mLs by mouth 2 (two) times daily between meals.     gabapentin 300 MG capsule  Commonly known as:  NEURONTIN  Take 300 mg by mouth every evening.     HYDROcodone-acetaminophen 5-325 MG per tablet  Commonly known as:  NORCO/VICODIN  Take 0.5-1 tablets by mouth every 4 (four) hours as needed for moderate pain or severe pain.     insulin glargine 100 UNIT/ML injection  Commonly known as:  LANTUS  Inject 0.12 mLs (12 Units total) into the skin at bedtime.     levothyroxine 112 MCG tablet  Commonly known as:  SYNTHROID, LEVOTHROID  Take 112 mcg by mouth daily before breakfast.     lidocaine-prilocaine cream  Commonly known as:  EMLA  Apply topically as needed. Apply 1 teaspoon to PAC site 1-2 hours prior to stick and cover with plastic wrap     magic mouthwash w/lidocaine Soln  Take 5 mLs by mouth 3 (three) times daily as needed (chest pain).     metoprolol tartrate 12.5 mg Tabs tablet  Commonly known as:  LOPRESSOR  Take 0.5 tablets (12.5 mg total) by mouth 2 (two) times daily.     multivitamin with minerals Tabs tablet  Take 1 tablet by mouth daily.     omeprazole 20 MG capsule  Commonly known as:  PRILOSEC  Take 1 capsule (20 mg total) by mouth 2 (two) times daily before a meal.     prochlorperazine 5 MG tablet  Commonly known as:  COMPAZINE  Take 1 tablet (5 mg total) by mouth every 6 (six) hours as needed for nausea.     senna 8.6 MG Tabs tablet  Commonly known as:  SENOKOT  Take 1 tablet (8.6 mg total) by mouth 2 (two) times daily.     simvastatin 40 MG tablet  Commonly known as:  ZOCOR  Take 20 mg by mouth every evening.     sucralfate 1 G tablet  Commonly known as:  CARAFATE  Take 1 tablet (1 g total) by mouth 4 (four) times daily.     warfarin 7.5 MG tablet  Commonly known as:  COUMADIN  Takes 7.5 mg 4 days a week  and 5 mg on Monday Wednesday and friday  Follow-up Information   Follow up with Thornton Papas, MD. Schedule an appointment as soon as possible for a visit in 2 weeks.   Specialty:  Oncology   Contact information:   [address] [place] [address] [phone]       Follow up with Tomma Lightning, MD.   Specialty:  Family Medicine   Contact information:   [address] [facility] [place] [address] [phone]        The results of significant diagnostics from this hospitalization (including imaging, microbiology, ancillary and laboratory) are listed below for reference.    Significant Diagnostic Studies: Dg Chest 2 View  01/09/2013   CLINICAL DATA:  Abdominal pain, nausea, vomiting  EXAM: CHEST  2 VIEW  COMPARISON:  04/12/2010  FINDINGS: The heart size and mediastinal contours are within normal limits. Elevation of the right diaphragm. Both lungs are clear. The visualized skeletal structures are unremarkable.  IMPRESSION: No active cardiopulmonary disease.   Electronically Signed   By: Elige Ko   On: 01/09/2013 08:29   Dg Abd 2 Views  01/09/2013   CLINICAL DATA:  Constipation and abdominal pain.  EXAM: ABDOMEN - 2 VIEW  COMPARISON:  None.  FINDINGS: Mild increased stool burden is noted in the colon. No obstruction. No free air.  There has been previous cholecystectomy. Some residual barium is noted in sigmoid colon diverticula. Intervertebral cages fusing L3-L4 level.  IMPRESSION: Mild increased colonic stool burden.  No obstruction.  No free air.   Electronically Signed   By: Amie Portland M.D.   On: 01/09/2013 12:43    Microbiology: Recent Results (from the past 240 hour(s))  URINE CULTURE     Status: None   Collection Time    01/09/13  9:04 AM      Result Value Range Status   Specimen Description URINE, CLEAN CATCH   Final   Special Requests NONE   Final   Culture  Setup Time     Final   Value: 01/09/2013 16:55      Performed at [facility]   Culture     Final   Value: INSIGNIFICANT GROWTH     Performed at [facility]   Report Status 01/10/2013 FINAL   Final  CULTURE, BLOOD (ROUTINE X 2)     Status: None   Collection Time    01/09/13  6:30 PM      Result Value Range Status   Specimen Description BLOOD RIGHT ARM   Final   Special Requests     Final   Value: BOTTLES DRAWN AEROBIC AND ANAEROBIC 5 CC BOTH BOTTLES   Culture  Setup Time     Final   Value: 01/10/2013 04:31     Performed at [facility]   Culture     Final   Value:        BLOOD CULTURE RECEIVED NO GROWTH TO DATE CULTURE WILL BE HELD FOR 5 DAYS BEFORE ISSUING A FINAL NEGATIVE REPORT     Performed at [facility]   Report Status PENDING   Incomplete  CULTURE, BLOOD (ROUTINE X 2)     Status: None   Collection Time    01/09/13  6:35 PM      Result Value Range Status   Specimen Description BLOOD RIGHT HAND   Final   Special Requests BOTTLES DRAWN AEROBIC ONLY 3CC   Final   Culture  Setup Time     Final  Value: 01/10/2013 04:31     Performed at [facility]   Culture     Final   Value:        BLOOD CULTURE RECEIVED NO GROWTH TO DATE CULTURE WILL BE HELD FOR 5 DAYS BEFORE ISSUING A FINAL NEGATIVE REPORT     Performed at [facility]   Report Status PENDING   Incomplete     Labs: Basic Metabolic Panel:  Recent Labs Lab 01/09/13 0840 01/10/13 0415 01/11/13 0340 01/12/13 0430  NA 125* 129* 127* 127*  K 5.0 4.2 3.7 3.7  CL 94* 98 96 99  CO2 23 25 23 22   GLUCOSE 173* 91 150* 141*  BUN 15 20 22 18   CREATININE 1.06 1.48* 1.58* 1.42*  CALCIUM 8.4 8.5 8.4 8.1*   Liver Function Tests:  Recent Labs Lab 01/09/13 0840 01/11/13 0340  AST 28 17  ALT 10 6  ALKPHOS 58 47  BILITOT 0.4 0.3  PROT 5.5* 4.8*  ALBUMIN 2.4* 1.9*    Recent Labs Lab 01/09/13 0840  LIPASE 15   No results found for this basename: AMMONIA,  in the last 168 hours CBC:  Recent Labs Lab  01/06/13 1349 01/09/13 0840 01/10/13 0415 01/11/13 0340 01/13/13 0446  WBC 3.8* 4.8 4.3 3.2* 2.8*  NEUTROABS 2.8 4.0  --  2.1  --   HGB 11.6* 11.7* 10.4* 10.4* 11.2*  HCT 34.4* 33.1* 30.0* 30.0* 32.1*  MCV 84.9 83.8 84.5 84.7 84.7  PLT 135* 147* 127* 112* 133*   Cardiac Enzymes:  Recent Labs Lab 01/09/13 0840 01/09/13 1500 01/09/13 2026  TROPONINI <0.30 <0.30 <0.30   Lab Results  Component Value Date   INR 1.58* 01/13/2013   INR 2.74* 01/12/2013   INR 2.86* 01/11/2013   PROTIME 32.4* 01/03/2013   PROTIME 20.4* 12/27/2012   PROTIME 22.8* 12/20/2012     BNP: BNP (last 3 results) No results found for this basename: PROBNP,  in the last 8760 hours CBG:  Recent Labs Lab 01/12/13 0747 01/12/13 1219 01/12/13 1658 01/12/13 2218 01/13/13 0753  GLUCAP 129* 237* 197* 204* 138*    Time coordinating discharge: 45 minutes  Signed:  Vu Liebman  [facility] 01/13/2013, 10:52 AM

## 2013-01-13 NOTE — Addendum Note (Signed)
Encounter addended by: Jonna Coup, MD on: 01/13/2013  5:39 PM<BR>     Documentation filed: Notes Section, Visit Diagnoses

## 2013-01-13 NOTE — Progress Notes (Signed)
  Radiation Oncology         [phone] ________________________________  Name: Luis Mclean MRN: [id number]  Date: 01/05/2013  DOB: 01-31-1930  End of Treatment Note  Diagnosis:   Esophageal cancer     Indication for treatment:  Palliative     Radiation treatment dates:   12/07/1998 413 through 01/05/2013     Site/dose:   The patient was treated with an AP PA arrangement initially to 9 gray in 5 fractions.  Daily image guidance was used during this treatment.  The patient planned then consisted of a  IMRT   technique on our tomotherapy unit with daily image guidance. He received an additional 14.4 gray in 8 fractions. When the patient was found to have metastatic disease. He was then re\re planned to receive a hyperfractionated course corresponding to 2.2 gray per fraction. In total the patient received 45.4 gray in 23 fractions. These treatments targeted the esophageal tumor and high risk adjacent regions.   Narrative: The patient tolerated radiation treatment relatively well.    The patient's swallowing did improve some during treatment. However he experienced significant fatigue, especially at the end of treatment.  Plan: The patient has completed radiation treatment. The patient will return to radiation oncology clinic for routine followup in one month. I advised the patient to call or return sooner if they have any questions or concerns related to their recovery or treatment. ________________________________  Radene Gunning, M.D., Ph.D.

## 2013-01-14 ENCOUNTER — Ambulatory Visit: Payer: Medicare Other

## 2013-01-14 ENCOUNTER — Other Ambulatory Visit: Payer: Self-pay

## 2013-01-14 DIAGNOSIS — C159 Malignant neoplasm of esophagus, unspecified: Secondary | ICD-10-CM

## 2013-01-14 MED ORDER — ALPRAZOLAM 0.25 MG PO TABS: 0.2500 mg | ORAL_TABLET | Freq: Every evening | ORAL | Status: DC | PRN

## 2013-01-14 MED ORDER — HYDROCODONE-ACETAMINOPHEN 5-325 MG PO TABS: 0.5000 | ORAL_TABLET | ORAL | Status: DC | PRN

## 2013-01-16 LAB — CULTURE, BLOOD (ROUTINE X 2)
Culture: NO GROWTH
Culture: NO GROWTH

## 2013-01-17 ENCOUNTER — Encounter: Payer: Self-pay | Admitting: Internal Medicine

## 2013-01-17 ENCOUNTER — Non-Acute Institutional Stay (SKILLED_NURSING_FACILITY): Payer: Medicare Other | Admitting: Internal Medicine

## 2013-01-17 DIAGNOSIS — E119 Type 2 diabetes mellitus without complications: Secondary | ICD-10-CM

## 2013-01-17 DIAGNOSIS — R5381 Other malaise: Secondary | ICD-10-CM

## 2013-01-17 DIAGNOSIS — I4891 Unspecified atrial fibrillation: Secondary | ICD-10-CM

## 2013-01-17 DIAGNOSIS — R531 Weakness: Secondary | ICD-10-CM

## 2013-01-17 DIAGNOSIS — C159 Malignant neoplasm of esophagus, unspecified: Secondary | ICD-10-CM

## 2013-01-17 DIAGNOSIS — I951 Orthostatic hypotension: Secondary | ICD-10-CM | POA: Insufficient documentation

## 2013-01-17 DIAGNOSIS — K5900 Constipation, unspecified: Secondary | ICD-10-CM | POA: Insufficient documentation

## 2013-01-17 DIAGNOSIS — E871 Hypo-osmolality and hyponatremia: Secondary | ICD-10-CM

## 2013-01-17 NOTE — Progress Notes (Signed)
Patient ID: Luis Mclean, male   DOB: 08/14/1929, 79 y.o.   MRN: [id number]  [facility]   PCP: Tomma Lightning, MD  Code Status: full code  No Known Allergies  Chief Complaint: new admit  HPI:  79 y/o male patient is here for rehabilitation after hospital admission from 01/09/13- 01/13/13 with nausea, vomiting and worsening weakness. He had odynophagia post radiation therapy for his esophageal adenocarcinoma. He was noted to be in afib with RVR. He was started on iv fluids, PPI dose was adjusted. He was also empirically started on broad spectrum antibiotics and later discontinue with no growth on blood culture. He was started on lopressor for rate control and coumadin was continued for anticoagulation.  He was seen in his room today with nursing supervisor and his wife present. He complaints of nausea and loss of appetite. No further difficulty swallowing. Denies heartburn. He also has been constipated. Has been passing flatus. No abdominal pain. He was working with therapy and felt very weak and had to sit down. No dyspnea or chest pain reported. No syncope  Review of Systems  Constitutional: Negative for fever, chills, weight loss, malaise/fatigue and diaphoresis.  HENT: Negative for congestion, hearing loss and sore throat.   Eyes: Negative for blurred vision, double vision and discharge.  Respiratory: Negative for cough, sputum production, shortness of breath and wheezing.   Cardiovascular: Negative for chest pain, palpitations, orthopnea and leg swelling.  Gastrointestinal: Negative for heartburn, vomiting, abdominal pain, diarrhea  Genitourinary: Negative for dysuria, urgency, frequency and flank pain.  Musculoskeletal: Negative for back pain, falls, joint pain and myalgias.  Skin: Negative for itching and rash.  Neurological: Positive for weakness. Negative for dizziness, tingling, focal weakness and headaches.  Psychiatric/Behavioral: Negative for depression and  memory loss. The patient is not nervous/anxious.     Past Medical History  Diagnosis Date  . Hyperlipidemia   . Hypertension   . Stroke   . GERD (gastroesophageal reflux disease)   . Cyst     back of neck  . Esophageal cancer 11/08/12    squmous cell and focal glandular diff  . Diabetes mellitus     type II  . A-fib   . Thyroid disease    Past Surgical History  Procedure Laterality Date  . Cholecystectomy  1989  . Rectal fissure repair  1985  . Back surgery  1989  . Esophageal biopsy  11/08/12    squamous cell & focal glandular diff   Social History:   reports that he quit smoking about 15 years ago. His smoking use included Cigars. He has quit using smokeless tobacco. He reports that he does not drink alcohol or use illicit drugs.  Family History  Problem Relation Age of Onset  . Cancer Brother     colon  . Cancer Brother     bone  . Cancer Brother     kidney/mets to bone    Medications: Patient's Medications  New Prescriptions   No medications on file  Previous Medications   ALPRAZOLAM (XANAX) 0.25 MG TABLET    Take 1 tablet (0.25 mg total) by mouth at bedtime as needed for sleep.   ALUM & MAG HYDROXIDE-SIMETH (MAGIC MOUTHWASH W/LIDOCAINE) SOLN    Take 5 mLs by mouth 3 (three) times daily as needed (chest pain).   DIPHENHYDRAMINE (SOMINEX) 25 MG TABLET    Take 25 mg by mouth at bedtime as needed for sleep.   DOCUSATE SODIUM 100 MG CAPS  Take 100 mg by mouth 2 (two) times daily.   FEEDING SUPPLEMENT, ENSURE, (ENSURE) PUDG    Take 1 Container by mouth daily.   GABAPENTIN (NEURONTIN) 300 MG CAPSULE    Take 300 mg by mouth every evening.   HYDROCODONE-ACETAMINOPHEN (NORCO/VICODIN) 5-325 MG PER TABLET    Take 0.5-1 tablets by mouth every 4 (four) hours as needed for moderate pain or severe pain. 1/2 -1 by mouth every 4 hours as needed for moderate to severe pain DO NOT EXCEED 4 GM OF TYLENOL IN 24 HOURS   INSULIN GLARGINE (LANTUS) 100 UNIT/ML INJECTION    Inject 0.12  mLs (12 Units total) into the skin at bedtime.   LACTOSE FREE NUTRITION (BOOST PLUS) LIQD    Take 237 mLs by mouth 2 (two) times daily between meals.   LEVOTHYROXINE (SYNTHROID, LEVOTHROID) 112 MCG TABLET    Take 112 mcg by mouth daily before breakfast.   LIDOCAINE-PRILOCAINE (EMLA) CREAM    Apply topically as needed. Apply 1 teaspoon to PAC site 1-2 hours prior to stick and cover with plastic wrap   METOPROLOL TARTRATE (LOPRESSOR) 12.5 MG TABS TABLET    Take 0.5 tablets (12.5 mg total) by mouth 2 (two) times daily.   MULTIPLE VITAMIN (MULTIVITAMIN WITH MINERALS) TABS TABLET    Take 1 tablet by mouth daily.   OMEPRAZOLE (PRILOSEC) 20 MG CAPSULE    Take 1 capsule (20 mg total) by mouth 2 (two) times daily before a meal.   PROCHLORPERAZINE (COMPAZINE) 5 MG TABLET    Take 1 tablet (5 mg total) by mouth every 6 (six) hours as needed for nausea.   SENNA (SENOKOT) 8.6 MG TABS TABLET    Take 1 tablet (8.6 mg total) by mouth 2 (two) times daily.   SIMVASTATIN (ZOCOR) 40 MG TABLET    Take 20 mg by mouth every evening.    SUCRALFATE (CARAFATE) 1 G TABLET    Take 1 tablet (1 g total) by mouth 4 (four) times daily.   WARFARIN (COUMADIN) 7.5 MG TABLET    Takes 7.5 mg 4 days a week and 5 mg on Monday Wednesday and friday  Modified Medications   No medications on file  Discontinued Medications   No medications on file    Physical Exam: Filed Vitals:   01/17/13 1603  BP: 114/80  Pulse: 77  Temp: 98.4 F (36.9 C)  Resp: 20  SpO2: 96%   Laying- 138/81, hr 86/min Sitting 126/78, hr 80/min Standing 98/ 59, hr 86/min  General- elderly male in no acute distress Head- atraumatic, normocephalic Eyes- PERRLA, EOMI, no pallor, no icterus Neck- no lymphadenopathy, no thyromegaly, no jugular vein distension, no carotid bruit Chest- no chest wall deformities, no chest wall tenderness Cardiovascular- irregular heart rate, normal s1,s2, no murmurs/ rubs/ gallops Respiratory- bilateral clear to auscultation,  no wheeze, no rhonchi, no crackles Abdomen- bowel sounds present, soft, non tender, no organomegaly, no abdominal bruits, no guarding or rigidity, no CVA tenderness Musculoskeletal- able to move all 4 extremities, no spinal and paraspinal tenderness, steady gait, no use of assistive device Neurological- no focal deficit, normal reflexes, normal muscle strength, normal sensation to fine touch and vibration Psychiatry- alert and oriented to person, place and time, normal mood and affect   Labs reviewed: Basic Metabolic Panel:  Recent Labs  19/14/78 0415 01/11/13 0340 01/12/13 0430  NA 129* 127* 127*  K 4.2 3.7 3.7  CL 98 96 99  CO2 25 23 22   GLUCOSE 91 150* 141*  BUN 20 22 18   CREATININE 1.48* 1.58* 1.42*  CALCIUM 8.5 8.4 8.1*   Liver Function Tests:  Recent Labs  01/09/13 0840 01/11/13 0340  AST 28 17  ALT 10 6  ALKPHOS 58 47  BILITOT 0.4 0.3  PROT 5.5* 4.8*  ALBUMIN 2.4* 1.9*    Recent Labs  01/09/13 0840  LIPASE 15   No results found for this basename: AMMONIA,  in the last 8760 hours CBC:  Recent Labs  01/06/13 1349 01/09/13 0840 01/10/13 0415 01/11/13 0340 01/13/13 0446  WBC 3.8* 4.8 4.3 3.2* 2.8*  NEUTROABS 2.8 4.0  --  2.1  --   HGB 11.6* 11.7* 10.4* 10.4* 11.2*  HCT 34.4* 33.1* 30.0* 30.0* 32.1*  MCV 84.9 83.8 84.5 84.7 84.7  PLT 135* 147* 127* 112* 133*   Cardiac Enzymes:  Recent Labs  01/09/13 0840 01/09/13 1500 01/09/13 2026  TROPONINI <0.30 <0.30 <0.30   BNP: No components found with this basename: POCBNP,  CBG:  Recent Labs  01/12/13 2218 01/13/13 0753 01/13/13 1150  GLUCAP 204* 138* 169*    Radiological Exams: Dg Chest 2 View  01/09/2013   CLINICAL DATA:  Abdominal pain, nausea, vomiting  EXAM: CHEST  2 VIEW  COMPARISON:  04/12/2010  FINDINGS: The heart size and mediastinal contours are within normal limits. Elevation of the right diaphragm. Both lungs are clear. The visualized skeletal structures are unremarkable.   IMPRESSION: No active cardiopulmonary disease.   Electronically Signed   By: Elige Ko   On: 01/09/2013 08:29   Dg Abd 2 Views  01/09/2013   CLINICAL DATA:  Constipation and abdominal pain.  EXAM: ABDOMEN - 2 VIEW  COMPARISON:  None.  FINDINGS: Mild increased stool burden is noted in the colon. No obstruction. No free air.  There has been previous cholecystectomy. Some residual barium is noted in sigmoid colon diverticula. Intervertebral cages fusing L3-L4 level.  IMPRESSION: Mild increased colonic stool burden.  No obstruction.  No free air.   Electronically Signed   By: Amie Portland M.D.   On: 01/09/2013 12:43    Assessment/Plan  Generalized weakness- in setting of deconditioning. Will need to work with therapy team for gait training and muscle group exercises. Fall precautions and skin care. Will provide ensure supplement  Metastatic esophageal adenocarcinoma- to follow with Dr. Truett Perna in 2 weeks   Long term anticoagulation- inr today 1.4 and last inr 1.3. Pt is currently on coumadin 5 mg M/W/F and 7.5 mg other days. Will change his coumadin to 7 mg daily and have inr checked 01/21/13, goal inr 2-3  afib- rate controlled with current dose of metoprolol. With orthostatics, will have his bp checked prior to giving metoprolol. Continue coumadin for stroke prevention  Orthostatic hypotension- will check cbc to rule out bleed. Will also encourage po intake with fluids and if no improvement in nausea, will consider iv fluids. Check bmp to r/o dehydration.  Given his hyponatremia and malignancy, concern for SIADH and might require vasopressin. Also check cortisol level to rule out adrenal insufficiency given his hyponatremia  Hyponatremia- likely from poor po intake and ? SIADH, monitor clinically. Will add salt supplement, encourage iv fluids and check cortisol level and bmp  Constipation- will have him on Senna s 1 tab po bid  Dm type 2- monitor cbg, continue lantus  GERD- continue  prilosec current regimen and sucralfate  Nausea- treat symptomatically with compazine  Family/ staff Communication: reviewed with family and nursing    Goals of care:  to return home   Labs/tests ordered- cbc, cmp, cortisol

## 2013-01-21 ENCOUNTER — Ambulatory Visit (HOSPITAL_COMMUNITY): Payer: Medicare Other

## 2013-01-24 DIAGNOSIS — C787 Secondary malignant neoplasm of liver and intrahepatic bile duct: Secondary | ICD-10-CM | POA: Insufficient documentation

## 2013-01-24 NOTE — Progress Notes (Signed)
Date: 01/13/13  MRN:  [id number] Name:  Luis Mclean Sex:  male Age:  79 y.o. DOB:June 11, 1929   PSC #:                       Facility/Room; Level Of Care: Provider:   Emergency Contacts: Contact Information   Name Relation Home Work Mobile   Nels[place] Spouse [phone]  [phone]      Code Status: MOST Form:  Allergies:No Known Allergies   Chief Complaint  Patient presents with  . Hospitalization Follow-up     HPI:  Pt is a Caucasian male, s/p, chemotherapy for esophageal adenocarcinoma with metastatic disease to the liver.  Received chemotherapy, and radiation therapy.  Developed radiation induced esophagitis, chemo induced pancytopenia, and dehydration.  Past Medical History  Diagnosis Date  . Hyperlipidemia   . Hypertension   . Stroke   . GERD (gastroesophageal reflux disease)   . Cyst     back of neck  . Esophageal cancer 11/08/12    squmous cell and focal glandular diff  . Diabetes mellitus     type II  . A-fib   . Thyroid disease     Past Surgical History  Procedure Laterality Date  . Cholecystectomy  1989  . Rectal fissure repair  1985  . Back surgery  1989  . Esophageal biopsy  11/08/12    squamous cell & focal glandular diff    Past Surgical History  Procedure Laterality Date  . Cholecystectomy  1989  . Rectal fissure repair  1985  . Back surgery  1989  . Esophageal biopsy  11/08/12    squamous cell & focal glandular diff    Current Outpatient Prescriptions  Medication Sig Dispense Refill  . ALPRAZolam (XANAX) 0.25 MG tablet Take 1 tablet (0.25 mg total) by mouth at bedtime as needed for sleep.  30 tablet  5  . Alum & Mag Hydroxide-Simeth (MAGIC MOUTHWASH W/LIDOCAINE) SOLN Take 5 mLs by mouth 3 (three) times daily as needed (chest pain).    0  . diphenhydrAMINE (SOMINEX) 25 MG tablet Take 25 mg by mouth at bedtime as needed for sleep.      Marland Kitchen docusate sodium 100 MG CAPS Take 100 mg by mouth 2 (two) times daily.  10 capsule   0  . feeding supplement, ENSURE, (ENSURE) PUDG Take 1 Container by mouth daily.    0  . gabapentin (NEURONTIN) 300 MG capsule Take 300 mg by mouth every evening.      Marland Kitchen HYDROcodone-acetaminophen (NORCO/VICODIN) 5-325 MG per tablet Take 0.5-1 tablets by mouth every 4 (four) hours as needed for moderate pain or severe pain. 1/2 -1 by mouth every 4 hours as needed for moderate to severe pain DO NOT EXCEED 4 GM OF TYLENOL IN 24 HOURS  180 tablet  0  . insulin glargine (LANTUS) 100 UNIT/ML injection Inject 0.12 mLs (12 Units total) into the skin at bedtime.  10 mL  12  . lactose free nutrition (BOOST PLUS) LIQD Take 237 mLs by mouth 2 (two) times daily between meals.    0  . levothyroxine (SYNTHROID, LEVOTHROID) 112 MCG tablet Take 112 mcg by mouth daily before breakfast.      . lidocaine-prilocaine (EMLA) cream Apply topically as needed. Apply 1 teaspoon to PAC site 1-2 hours prior to stick and cover with plastic wrap  30 g  prn  . metoprolol tartrate (LOPRESSOR) 12.5 mg TABS tablet Take 0.5 tablets (12.5 mg total) by mouth 2 (  two) times daily.      . Multiple Vitamin (MULTIVITAMIN WITH MINERALS) TABS tablet Take 1 tablet by mouth daily.      Marland Kitchen omeprazole (PRILOSEC) 20 MG capsule Take 1 capsule (20 mg total) by mouth 2 (two) times daily before a meal.      . prochlorperazine (COMPAZINE) 5 MG tablet Take 1 tablet (5 mg total) by mouth every 6 (six) hours as needed for nausea.  60 tablet  1  . senna (SENOKOT) 8.6 MG TABS tablet Take 1 tablet (8.6 mg total) by mouth 2 (two) times daily.  120 each  0  . simvastatin (ZOCOR) 40 MG tablet Take 20 mg by mouth every evening.       . sucralfate (CARAFATE) 1 G tablet Take 1 tablet (1 g total) by mouth 4 (four) times daily.  120 tablet  2  . warfarin (COUMADIN) 7.5 MG tablet Takes 7.5 mg 4 days a week and 5 mg on Monday Wednesday and friday  45 tablet  0   No current facility-administered medications for this visit.    Immunization History  Administered Date(s)  Administered  . Influenza,inj,Quad PF,36+ Mos 12/06/2012   Review of Systems  Constitutional: Positive for weight loss and malaise/fatigue.  HENT: Positive for sore throat. Negative for ear discharge, hearing loss and nosebleeds.   Cardiovascular: Negative for orthopnea and claudication.  Gastrointestinal: Positive for heartburn and nausea. Negative for blood in stool and melena.  Genitourinary: Negative for hematuria and flank pain.  Neurological: Negative for seizures and loss of consciousness.  Psychiatric/Behavioral: Positive for depression. Negative for suicidal ideas.    Diet:  History  Substance Use Topics  . Smoking status: Former Smoker -- 30 years    Types: Cigars    Quit date: 05/05/1997  . Smokeless tobacco: Former Neurosurgeon  . Alcohol Use: No    Family History  Problem Relation Age of Onset  . Cancer Brother     colon  . Cancer Brother     bone  . Cancer Brother     kidney/mets to bone     Problem List as of 01/13/2013     Cardiovascular and Mediastinum   Atrial fibrillation with RVR     Digestive   Esophagus cancer   Esophageal cancer   Malignant neoplasm of lower third of esophagus   Esophageal adenocarcinoma     Musculoskeletal and Integument   Sebaceous cyst, posterior neck     Genitourinary   Chronic kidney disease, stage 3     Hematopoietic and Hemostatic   Antineoplastic chemotherapy induced pancytopenia     Other   Hyponatremia   Dehydration     Labs from Hospitalization:  Urinalysis unremarkable CBC  WBC 4.8 Hemoglobin 11.7 Platelets 147,000  Other labs Troponin negative Lactic acid 1.0 CXR negative The EKG showed atrial fibrillation with nonspecific T wave flattening Chest x-ray negative Sodium 135 Potassium 5.0   Vital Signs: AP 82 BP 124/78 RR 20 Temp 98.2  Physical Exam  Constitutional: He is oriented to person, place, and time.  HENT:  Head: Normocephalic.  Nose: Nose normal.  Eyes: EOM are normal. Pupils are  equal, round, and reactive to light. Right eye exhibits no discharge. Left eye exhibits no discharge. No scleral icterus.  Neck: Neck supple. No JVD present. No tracheal deviation present. No thyromegaly present.  Pulmonary/Chest: Breath sounds normal. No respiratory distress. He exhibits no tenderness.  Abdominal: Soft. Bowel sounds are normal. There is tenderness.  Neurological: He is alert  and oriented to person, place, and time.  Skin: Skin is warm and dry. There is pallor.  Psychiatric: He has a normal mood and affect.   Oral Pharynx:  No gross oral lesions or candasis   Assessment/Plan: Radiation-induced esophagitis, most continue with the Carafate and the omeprazole twice a day Generalized weakness, patient will receive PT OT is appropriate will implement use of high calorie/high protein nutrition supplements. Atrial fibrillation we'll continue with Coumadin and regular assessment to maintain the INR between 2 and 3. Chronic kidney disease, stage III, we'll continue to monitor her renal function be a regular lab work encourage fluids as well. Will monitor edema address as necessary. Esophageal adenocarcinoma with metastatic disease to the liver, patient will have continued followup with his oncologist. Order baseline lab work today.

## 2013-01-25 ENCOUNTER — Ambulatory Visit (HOSPITAL_BASED_OUTPATIENT_CLINIC_OR_DEPARTMENT_OTHER): Payer: Medicare Other | Admitting: Nurse Practitioner

## 2013-01-25 ENCOUNTER — Encounter (HOSPITAL_COMMUNITY): Payer: Self-pay

## 2013-01-25 ENCOUNTER — Other Ambulatory Visit (HOSPITAL_BASED_OUTPATIENT_CLINIC_OR_DEPARTMENT_OTHER): Payer: Medicare Other | Admitting: Lab

## 2013-01-25 ENCOUNTER — Ambulatory Visit (HOSPITAL_COMMUNITY)
Admission: RE | Admit: 2013-01-25 | Discharge: 2013-01-25 | Disposition: A | Discharge status: Home / Self Care | Payer: Medicare Other | Source: Ambulatory Visit | Attending: Oncology | Admitting: Oncology

## 2013-01-25 VITALS — BP 107/63 | HR 59 | Temp 96.8°F | Resp 20

## 2013-01-25 DIAGNOSIS — I700 Atherosclerosis of aorta: Secondary | ICD-10-CM | POA: Insufficient documentation

## 2013-01-25 DIAGNOSIS — C787 Secondary malignant neoplasm of liver and intrahepatic bile duct: Secondary | ICD-10-CM | POA: Insufficient documentation

## 2013-01-25 DIAGNOSIS — N289 Disorder of kidney and ureter, unspecified: Secondary | ICD-10-CM

## 2013-01-25 DIAGNOSIS — E119 Type 2 diabetes mellitus without complications: Secondary | ICD-10-CM

## 2013-01-25 DIAGNOSIS — C159 Malignant neoplasm of esophagus, unspecified: Secondary | ICD-10-CM

## 2013-01-25 DIAGNOSIS — I4891 Unspecified atrial fibrillation: Secondary | ICD-10-CM

## 2013-01-25 DIAGNOSIS — I482 Chronic atrial fibrillation, unspecified: Secondary | ICD-10-CM

## 2013-01-25 DIAGNOSIS — J90 Pleural effusion, not elsewhere classified: Secondary | ICD-10-CM | POA: Insufficient documentation

## 2013-01-25 DIAGNOSIS — C155 Malignant neoplasm of lower third of esophagus: Secondary | ICD-10-CM

## 2013-01-25 DIAGNOSIS — K5730 Diverticulosis of large intestine without perforation or abscess without bleeding: Secondary | ICD-10-CM | POA: Insufficient documentation

## 2013-01-25 DIAGNOSIS — K449 Diaphragmatic hernia without obstruction or gangrene: Secondary | ICD-10-CM | POA: Insufficient documentation

## 2013-01-25 LAB — BASIC METABOLIC PANEL (CC13)
Anion Gap: 8 mEq/L (ref 3–11)
BUN: 19.7 mg/dL (ref 7.0–26.0)
CO2: 22 mEq/L (ref 22–29)
Calcium: 8.9 mg/dL (ref 8.4–10.4)
Chloride: 102 mEq/L (ref 98–109)
Creatinine: 1.5 mg/dL — ABNORMAL HIGH (ref 0.7–1.3)
Glucose: 271 mg/dl — ABNORMAL HIGH (ref 70–140)

## 2013-01-25 MED ORDER — IOHEXOL 300 MG/ML  SOLN
80.0000 mL | Freq: Once | INTRAMUSCULAR | Status: AC | PRN
  Administered 2013-01-25: 80 mL via INTRAVENOUS

## 2013-01-25 NOTE — Progress Notes (Addendum)
OFFICE PROGRESS NOTE  Interval history:  Luis Mclean returns for followup of esophagus cancer. He completed radiation on 01/05/2013. He was hospitalized 01/09/2013 with nausea, vomiting and generalized weakness. He was discharged to a nursing facility on 01/13/2013.  He feels weak. He is unable to participate significantly in physical therapy due to a periodic low blood pressure. Odynophagia he was experiencing is better. Oral intake varies. He has some nausea and some loose stools.   Objective: Blood pressure 107/63, pulse 59, temperature 96.8 F (36 C), temperature source Oral, resp. rate 20, weight 0 lb (0 kg).  No thrush. Lungs are clear. Irregular cardiac rhythm. Abdomen soft. Trace bilateral lower extremity edema. Moves all extremities. Alert and oriented.  Lab Results: Lab Results  Component Value Date   WBC 2.8* 01/13/2013   HGB 11.2* 01/13/2013   HCT 32.1* 01/13/2013   MCV 84.7 01/13/2013   PLT 133* 01/13/2013    Chemistry:    Chemistry      Component Value Date/Time   NA 132* 01/25/2013 1100   NA 127* 01/12/2013 0430   K 4.2 01/25/2013 1100   K 3.7 01/12/2013 0430   CL 99 01/12/2013 0430   CO2 22 01/25/2013 1100   CO2 22 01/12/2013 0430   BUN 19.7 01/25/2013 1100   BUN 18 01/12/2013 0430   CREATININE 1.5* 01/25/2013 1100   CREATININE 1.42* 01/12/2013 0430   GLU 421* 12/06/2012 1503      Component Value Date/Time   CALCIUM 8.9 01/25/2013 1100   CALCIUM 8.1* 01/12/2013 0430   ALKPHOS 47 01/11/2013 0340   ALKPHOS 62 01/03/2013 1439   AST 17 01/11/2013 0340   AST 18 01/03/2013 1439   ALT 6 01/11/2013 0340   ALT 9 01/03/2013 1439   BILITOT 0.3 01/11/2013 0340   BILITOT 0.25 01/03/2013 1439       Studies/Results: Dg Chest 2 View  01/09/2013   CLINICAL DATA:  Abdominal pain, nausea, vomiting  EXAM: CHEST  2 VIEW  COMPARISON:  04/12/2010  FINDINGS: The heart size and mediastinal contours are within normal limits. Elevation of the right diaphragm. Both lungs  are clear. The visualized skeletal structures are unremarkable.  IMPRESSION: No active cardiopulmonary disease.   Electronically Signed   By: Elige Ko   On: 01/09/2013 08:29   Ct Abdomen W Contrast  01/25/2013   CLINICAL DATA:  Restaging esophageal cancer  EXAM: CT ABDOMEN WITH CONTRAST  TECHNIQUE: Multidetector CT imaging of the abdomen was performed using the standard protocol following bolus administration of intravenous contrast.  CONTRAST:  80mL OMNIPAQUE IOHEXOL 300 MG/ML  SOLN  COMPARISON:  PET-CT 12/02/2012  FINDINGS: There are small bilateral pleural effusions identified. These are new from previous exam. No pulmonary nodule or mass identified. There is a 1.6 cm low attenuation lesion within the posterior right hepatic lobe, image 16/series 2. This is compared with 4.6 cm previously. Previous cholecystectomy. No biliary dilatation. Normal appearance of the pancreas. The spleen is unremarkable.  The adrenal glands are both normal. Stable cyst arising from the anterior cortex of the left kidney measuring 3.1 cm. Left kidney is unremarkable.  Calcified atherosclerotic disease involves the abdominal aorta. There is no aneurysm. No upper abdominal adenopathy identified. There is no free fluid or abnormal fluid collections noted. Small hiatal hernia is identified. The stomach is otherwise unremarkable. The small bowel loops have a normal caliber without obstruction. The visualized portions of the colon are remarkable for multiple colonic diverticula. No acute inflammation.  IMPRESSION: 1. Improved  appearance of right hepatic lobe metastasis. 2. Resolution of previous upper abdominal adenopathy. 3. Hiatal hernia with non visualization of distal esophageal mass.   Electronically Signed   By: Signa Kell M.D.   On: 01/25/2013 16:02   Dg Abd 2 Views  01/09/2013   CLINICAL DATA:  Constipation and abdominal pain.  EXAM: ABDOMEN - 2 VIEW  COMPARISON:  None.  FINDINGS: Mild increased stool burden is noted  in the colon. No obstruction. No free air.  There has been previous cholecystectomy. Some residual barium is noted in sigmoid colon diverticula. Intervertebral cages fusing L3-L4 level.  IMPRESSION: Mild increased colonic stool burden.  No obstruction.  No free air.   Electronically Signed   By: Amie Portland M.D.   On: 01/09/2013 12:43    Medications: I have reviewed the patient's current medications.  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  Initiation of radiation and weekly Taxol/carboplatin 12/06/2012.  Radiation completed 01/05/2013. CT 01/25/2013 with resolution of upper abdominal adenopathy and a decreased size of the liver metastasis 2. Indeterminate right hepatic mass on ultrasound-10/20/2012/MRI-10/29/2012 , hypermetabolic on the PET scan 12/02/2012. 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  3. Dysphagia/subxiphoid pain secondary to #1. Improved.  4. History of atrial fibrillation-maintained on Coumadin.  5. Renal insufficiency.  6. History of a CVA.  7. 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  8. Diabetes.  9. Odynophagia. Improved.  Disposition-he continues to have a poor performance status. He will continue physical therapy at the nursing facility.  Followup CT scan done earlier today shows improvement in the right hepatic lobe metastasis and resolution of previous upper abdominal adenopathy.  At present due to Luis current performance status he is not a candidate for systemic chemotherapy. When he returns for followup in approximately 3 weeks Dr. Truett Perna will discuss initiation of additional chemotherapy.  We did not have the results of the CT scan during the office  visit. We will contact Luis Mclean and Luis Mclean with the results.  Patient seen with Dr. Truett Perna.  Lonna Cobb ANP/GNP-BC    This was a shared visit with Lonna Cobb. Luis Mclean continues to have a poor performance status following treatment with chemotherapy and radiation. We reviewed the CT images with Luis Mclean and Luis Mclean. The metastatic liver lesion has decreased in size indicating a response to systemic therapy.  We plan to deliver additional Taxol/carboplatin chemotherapy when Luis performance status improves. I encouraged him to increase Luis oral intake and work with physical therapy. He will return for an office visit in 3 weeks.   Mancel Bale, M.D.

## 2013-01-25 NOTE — Telephone Encounter (Signed)
appts made and printed...td 

## 2013-01-27 ENCOUNTER — Telehealth: Payer: Self-pay | Admitting: *Deleted

## 2013-01-27 NOTE — Telephone Encounter (Signed)
Call from pt's wife requesting CT results. Per Dr. Truett Perna: CT looks better. Mrs. Appleby voiced appreciation for call.

## 2013-02-04 ENCOUNTER — Non-Acute Institutional Stay (SKILLED_NURSING_FACILITY): Payer: Medicare Other | Admitting: Adult Health

## 2013-02-04 DIAGNOSIS — R531 Weakness: Secondary | ICD-10-CM

## 2013-02-04 DIAGNOSIS — R5381 Other malaise: Secondary | ICD-10-CM

## 2013-02-04 DIAGNOSIS — C159 Malignant neoplasm of esophagus, unspecified: Secondary | ICD-10-CM

## 2013-02-04 DIAGNOSIS — N183 Chronic kidney disease, stage 3 unspecified: Secondary | ICD-10-CM

## 2013-02-05 ENCOUNTER — Encounter: Payer: Self-pay | Admitting: Adult Health

## 2013-02-05 NOTE — Progress Notes (Signed)
Patient ID: Luis Mclean, male   DOB: [birth year], 79 y.o.   MRN: [id number]     [facility]  No Known Allergies   Chief Complaint  Patient presents with  . Discharge Note    HPI:  He is being discharged to home with home health for pt/ot/nursing. He will need 3:1 commode; in order to maintain his level of independence. He has done well with therapy to regain his strength.   Past Medical History  Diagnosis Date  . Hyperlipidemia   . Hypertension   . Stroke   . GERD (gastroesophageal reflux disease)   . Cyst     back of neck  . Esophageal cancer 11/08/12    squmous cell and focal glandular diff  . Diabetes mellitus     type II  . A-fib   . Thyroid disease     Past Surgical History  Procedure Laterality Date  . Cholecystectomy  1989  . Rectal fissure repair  1985  . Back surgery  1989  . Esophageal biopsy  11/08/12    squamous cell & focal glandular diff    VITAL SIGNS BP 126/78  Pulse 70  Ht 5\' 11"  (1.803 m)  Wt 178 lb (80.74 kg)  BMI 24.84 kg/m2   Patient's Medications  New Prescriptions   No medications on file  Previous Medications   ALPRAZOLAM (XANAX) 0.25 MG TABLET    Take 1 tablet (0.25 mg total) by mouth at bedtime as needed for sleep.   DIPHENHYDRAMINE (SOMINEX) 25 MG TABLET    Take 25 mg by mouth at bedtime as needed for sleep.   DOCUSATE SODIUM 100 MG CAPS    Take 100 mg by mouth 2 (two) times daily.   FEEDING SUPPLEMENT, ENSURE, (ENSURE) PUDG    Take 1 Container by mouth daily.   GABAPENTIN (NEURONTIN) 300 MG CAPSULE    Take 100-300 mg by mouth 3 (three) times daily. 100 mg twice daily and 300 mg at night   HYDROCODONE-ACETAMINOPHEN (NORCO/VICODIN) 5-325 MG PER TABLET    Take 0.5-1 tablets by mouth every 4 (four) hours as needed for moderate pain or severe pain. 1/2 -1 by mouth every 4 hours as needed for moderate to severe pain DO NOT EXCEED 4 GM OF TYLENOL IN 24 HOURS   INSULIN GLARGINE (LANTUS) 100 UNIT/ML INJECTION    Inject 0.12 mLs (12  Units total) into the skin at bedtime.   LACTOSE FREE NUTRITION (BOOST PLUS) LIQD    Take 237 mLs by mouth 2 (two) times daily between meals.   LEVOTHYROXINE (SYNTHROID, LEVOTHROID) 112 MCG TABLET    Take 112 mcg by mouth daily before breakfast.   LIDOCAINE-PRILOCAINE (EMLA) CREAM    Apply topically as needed. Apply 1 teaspoon to PAC site 1-2 hours prior to stick and cover with plastic wrap   MULTIPLE VITAMIN (MULTIVITAMIN WITH MINERALS) TABS TABLET    Take 1 tablet by mouth daily.   OMEPRAZOLE (PRILOSEC) 20 MG CAPSULE    Take 1 capsule (20 mg total) by mouth 2 (two) times daily before a meal.   PROCHLORPERAZINE (COMPAZINE) 5 MG TABLET    Take 1 tablet (5 mg total) by mouth every 6 (six) hours as needed for nausea.   SENNA (SENOKOT) 8.6 MG TABS TABLET    Take 1 tablet (8.6 mg total) by mouth 2 (two) times daily.   SIMVASTATIN (ZOCOR) 40 MG TABLET    Take 20 mg by mouth every evening.    SUCRALFATE (CARAFATE) 1 G  TABLET    Take 1 tablet (1 g total) by mouth 4 (four) times daily.  Modified Medications   Modified Medication Previous Medication   WARFARIN (COUMADIN) 7.5 MG TABLET warfarin (COUMADIN) 7.5 MG tablet      Take 7.5 mg by mouth daily at 6 PM.    Takes 7.5 mg 4 days a week and 5 mg on Monday Wednesday and friday  Discontinued Medications   ALUM & MAG HYDROXIDE-SIMETH (MAGIC MOUTHWASH W/LIDOCAINE) SOLN    Take 5 mLs by mouth 3 (three) times daily as needed (chest pain).   METOPROLOL TARTRATE (LOPRESSOR) 12.5 MG TABS TABLET    Take 0.5 tablets (12.5 mg total) by mouth 2 (two) times daily.    SIGNIFICANT DIAGNOSTIC EXAMS  LABS REVIEWED:  01-18-13:wbc 3.4; hgb 11.4; hct 33.8; mcv 84.9; plt 199; glucose 152; bun 17; creat 17; creat 1.2; k+4.2 Na++ 129; liver normal albumin 2.5  02-04-13: glucose 253; bun 17; creat 1.2; k+4.3; na++ 132    Review of Systems  Constitutional: Negative for malaise/fatigue.  Respiratory: Negative for cough and shortness of breath.   Cardiovascular: Negative  for palpitations.  Gastrointestinal: Negative for heartburn and abdominal pain.  Musculoskeletal: Negative for joint pain and myalgias.  Skin: Negative.   Neurological: Negative for headaches.  Psychiatric/Behavioral: Negative for depression. The patient is not nervous/anxious.     Physical Exam  Constitutional: He is oriented to person, place, and time. He appears well-developed. No distress.  Neck: No JVD present.  Cardiovascular: Normal rate, regular rhythm and intact distal pulses.   Respiratory: Effort normal and breath sounds normal. No respiratory distress.  GI: Soft. Bowel sounds are normal. He exhibits no distension. There is no tenderness.  Musculoskeletal: Normal range of motion. He exhibits no edema.  Neurological: He is alert and oriented to person, place, and time.  Skin: Skin is warm and dry. He is not diaphoretic. No erythema.       ASSESSMENT/ PLAN:  Will discharge him to home with home health for pt/ot/nursing; will need a 3:1 commode; prescriptions needed.   Time spent with patient 35 minutes.

## 2013-02-08 ENCOUNTER — Telehealth: Payer: Self-pay | Admitting: *Deleted

## 2013-02-08 DIAGNOSIS — E119 Type 2 diabetes mellitus without complications: Secondary | ICD-10-CM

## 2013-02-08 DIAGNOSIS — I4891 Unspecified atrial fibrillation: Secondary | ICD-10-CM

## 2013-02-08 DIAGNOSIS — C159 Malignant neoplasm of esophagus, unspecified: Secondary | ICD-10-CM

## 2013-02-08 DIAGNOSIS — M216X9 Other acquired deformities of unspecified foot: Secondary | ICD-10-CM

## 2013-02-08 NOTE — Telephone Encounter (Signed)
Call from Rochele Pages with [facility] reporting PT 30.1 INR 2.5. Pt takes 5mg  Warfarin MWF 7.5 mg all other days. Reviewed with Lonna Cobb NP: Continue same dose. Will check INR when in office on 12/23. [facility] RN will make pt aware.

## 2013-02-09 ENCOUNTER — Telehealth: Payer: Self-pay | Admitting: Oncology

## 2013-02-09 NOTE — Telephone Encounter (Signed)
s.w. pt and advised on lab b4 appt on 12.23.14.Marland KitchenMarland Kitchenpt ok and aware

## 2013-02-10 ENCOUNTER — Ambulatory Visit: Payer: Self-pay | Admitting: Radiation Oncology

## 2013-02-15 ENCOUNTER — Other Ambulatory Visit (HOSPITAL_BASED_OUTPATIENT_CLINIC_OR_DEPARTMENT_OTHER): Payer: Medicare Other

## 2013-02-15 ENCOUNTER — Telehealth: Payer: Self-pay | Admitting: *Deleted

## 2013-02-15 ENCOUNTER — Ambulatory Visit (HOSPITAL_BASED_OUTPATIENT_CLINIC_OR_DEPARTMENT_OTHER): Payer: Medicare Other | Admitting: Oncology

## 2013-02-15 VITALS — BP 106/65 | HR 102 | Temp 97.7°F | Resp 18 | Ht 71.0 in | Wt 181.2 lb

## 2013-02-15 DIAGNOSIS — C159 Malignant neoplasm of esophagus, unspecified: Secondary | ICD-10-CM

## 2013-02-15 DIAGNOSIS — C787 Secondary malignant neoplasm of liver and intrahepatic bile duct: Secondary | ICD-10-CM

## 2013-02-15 DIAGNOSIS — Z8673 Personal history of transient ischemic attack (TIA), and cerebral infarction without residual deficits: Secondary | ICD-10-CM

## 2013-02-15 DIAGNOSIS — I4891 Unspecified atrial fibrillation: Secondary | ICD-10-CM

## 2013-02-15 DIAGNOSIS — Z7901 Long term (current) use of anticoagulants: Secondary | ICD-10-CM

## 2013-02-15 NOTE — Telephone Encounter (Signed)
Per staff message and POF I have scheduled appts.  JMW  

## 2013-02-15 NOTE — Progress Notes (Signed)
   [facility]    OFFICE PROGRESS NOTE   INTERVAL HISTORY:   He returns for scheduled followup of esophagus cancer. He has returned and has an improved energy level. He continues home physical therapy. He has mild "heartburn ". No significant dysphagia.  Objective:  Vital signs in last 24 hours:  Blood pressure 106/65, pulse 102, temperature 97.7 F (36.5 C), temperature source Oral, resp. rate 18, height 5\' 11"  (1.803 m), weight 181 lb 3.2 oz (82.192 kg).    HEENT: No thrush Resp: Lungs clear bilaterally Cardio: Irregular GI: No hepatomegaly, nontender, no mass Vascular: No leg edema Neuro: He can ambulate a short distance with assistance    Portacath/PICC-without erythema  Lab Results:  PT/INR 4.2   Medications: I have reviewed the patient's current medications.  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  Initiation of radiation and weekly Taxol/carboplatin 12/06/2012.  Radiation completed 01/05/2013.  CT 01/25/2013 with resolution of upper abdominal adenopathy and a decreased size of the liver metastasis 2. Indeterminate right hepatic mass on ultrasound-10/20/2012/MRI-10/29/2012 , hypermetabolic on the PET scan 12/02/2012. 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  3. Dysphagia/subxiphoid pain secondary to #1. Improved.  4. History of atrial fibrillation-maintained on Coumadin.  5. Renal insufficiency.  6. History of a CVA.  7. 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  8. Diabetes.  9. Odynophagia. Improved.    Disposition:  Luis Mclean has returned home. His performance status appears to be slowly improving. The plan is to resume weekly  Taxol/carboplatin chemotherapy on 03/04/2013. He will be seen for an office visit that day.  The PT INR is supratherapeutic today. He will hold Coumadin. We will arrange for a home PT INR check on 02/16/2013.   Thornton Papas, MD  02/15/2013  4:33 PM   A

## 2013-02-15 NOTE — Telephone Encounter (Signed)
Needs PT/INR drawn on 12/24 and results called to office. Was on hold for several minutes, then automatically put into nursing voicemail.  Requested lab and to have results called to office early on 12/24.

## 2013-02-16 ENCOUNTER — Telehealth: Payer: Self-pay | Admitting: *Deleted

## 2013-02-16 ENCOUNTER — Encounter: Payer: Self-pay | Admitting: *Deleted

## 2013-02-16 ENCOUNTER — Telehealth: Payer: Self-pay | Admitting: Oncology

## 2013-02-16 NOTE — Telephone Encounter (Signed)
Talked to pt and he is aware of appt on 03/04/13 lab,mL and chemo

## 2013-02-16 NOTE — Telephone Encounter (Signed)
INR today 3.6/ PT today 43.1--Per Dr. Truett Perna, hold Coumadin 12/24 & 12/25. Resume 5 mg daily on 12/26 and recheck 12/29. Patient notified. He wants his home health nurse to check it on Monday instead of coming in Monday. Will call [facility] on Friday with order for Monday.

## 2013-02-16 NOTE — Telephone Encounter (Signed)
Called to report the home health nurse plan is to go to home on 12/26 to see patient and check PT/INR. Informed nurse that it needs to occur TODAY and results called to this office since patient's INR was high yesterday and he has been holding his Coumadin. This was clearly spelled out in the voice mail left for the triage nurse at [facility] yesterday. She will contact nurse and have it done today and results called to office or on call physician.

## 2013-02-18 ENCOUNTER — Telehealth: Payer: Self-pay | Admitting: *Deleted

## 2013-02-18 NOTE — Telephone Encounter (Signed)
Call from [facility] RN. Pt's INR 1.4 today, was scheduled for nursing visit so RN checked INR while there. Reviewed with Dr. Truett Perna, take 5 mg Coumadin daily. Recheck INR on 12/29.

## 2013-02-21 ENCOUNTER — Telehealth: Payer: Self-pay | Admitting: *Deleted

## 2013-02-21 NOTE — Telephone Encounter (Signed)
PT 17.9/ INR 1.5 today

## 2013-02-22 ENCOUNTER — Telehealth: Payer: Self-pay | Admitting: *Deleted

## 2013-02-22 NOTE — Telephone Encounter (Signed)
Notified patient to continue 5mg  daily of Coumadin and recheck PT in 1 week. Called Derald Macleod, RN with orders to recheck PT on 02/28/13.

## 2013-02-25 ENCOUNTER — Other Ambulatory Visit: Payer: Self-pay | Admitting: Oncology

## 2013-02-28 ENCOUNTER — Telehealth: Payer: Self-pay | Admitting: *Deleted

## 2013-02-28 NOTE — Telephone Encounter (Signed)
INR today 2.20 On Coumadin 5 mg daily. Has OV 03/04/13.

## 2013-03-01 ENCOUNTER — Telehealth: Payer: Self-pay | Admitting: *Deleted

## 2013-03-01 NOTE — Telephone Encounter (Signed)
Instructed patient to continue Coumadin 5 mg daily based on INR of 2.2 from Charlann Noss, RN with [facility] from yesterday. Follow up 1/9 as scheduled.

## 2013-03-04 ENCOUNTER — Other Ambulatory Visit: Payer: Self-pay | Admitting: Nurse Practitioner

## 2013-03-04 ENCOUNTER — Telehealth: Payer: Self-pay | Admitting: *Deleted

## 2013-03-04 ENCOUNTER — Other Ambulatory Visit (HOSPITAL_BASED_OUTPATIENT_CLINIC_OR_DEPARTMENT_OTHER): Payer: Medicare Other

## 2013-03-04 ENCOUNTER — Ambulatory Visit (HOSPITAL_BASED_OUTPATIENT_CLINIC_OR_DEPARTMENT_OTHER): Payer: Medicare Other

## 2013-03-04 ENCOUNTER — Telehealth: Payer: Self-pay | Admitting: Oncology

## 2013-03-04 ENCOUNTER — Ambulatory Visit (HOSPITAL_BASED_OUTPATIENT_CLINIC_OR_DEPARTMENT_OTHER): Payer: Medicare Other | Admitting: Nurse Practitioner

## 2013-03-04 VITALS — BP 102/67 | HR 108 | Temp 97.2°F | Resp 19 | Ht 71.0 in | Wt 184.5 lb

## 2013-03-04 DIAGNOSIS — C159 Malignant neoplasm of esophagus, unspecified: Secondary | ICD-10-CM

## 2013-03-04 DIAGNOSIS — R1310 Dysphagia, unspecified: Secondary | ICD-10-CM

## 2013-03-04 DIAGNOSIS — E119 Type 2 diabetes mellitus without complications: Secondary | ICD-10-CM

## 2013-03-04 DIAGNOSIS — C787 Secondary malignant neoplasm of liver and intrahepatic bile duct: Secondary | ICD-10-CM

## 2013-03-04 DIAGNOSIS — C155 Malignant neoplasm of lower third of esophagus: Secondary | ICD-10-CM

## 2013-03-04 DIAGNOSIS — I4891 Unspecified atrial fibrillation: Secondary | ICD-10-CM

## 2013-03-04 DIAGNOSIS — Z5111 Encounter for antineoplastic chemotherapy: Secondary | ICD-10-CM

## 2013-03-04 DIAGNOSIS — N289 Disorder of kidney and ureter, unspecified: Secondary | ICD-10-CM

## 2013-03-04 LAB — CBC WITH DIFFERENTIAL/PLATELET
BASO%: 0.4 % (ref 0.0–2.0)
BASOS ABS: 0 10*3/uL (ref 0.0–0.1)
EOS%: 3.9 % (ref 0.0–7.0)
Eosinophils Absolute: 0.2 10*3/uL (ref 0.0–0.5)
HCT: 35.9 % — ABNORMAL LOW (ref 38.4–49.9)
HEMOGLOBIN: 12 g/dL — AB (ref 13.0–17.1)
LYMPH%: 28.5 % (ref 14.0–49.0)
MCH: 30.2 pg (ref 27.2–33.4)
MCHC: 33.4 g/dL (ref 32.0–36.0)
MCV: 90.2 fL (ref 79.3–98.0)
MONO#: 0.8 10*3/uL (ref 0.1–0.9)
MONO%: 13.5 % (ref 0.0–14.0)
NEUT%: 53.7 % (ref 39.0–75.0)
NEUTROS ABS: 3 10*3/uL (ref 1.5–6.5)
Platelets: 166 10*3/uL (ref 140–400)
RBC: 3.98 10*6/uL — ABNORMAL LOW (ref 4.20–5.82)
RDW: 16.4 % — AB (ref 11.0–14.6)
WBC: 5.7 10*3/uL (ref 4.0–10.3)
lymph#: 1.6 10*3/uL (ref 0.9–3.3)

## 2013-03-04 LAB — COMPREHENSIVE METABOLIC PANEL (CC13)
ALT: 10 U/L (ref 0–55)
ANION GAP: 6 meq/L (ref 3–11)
AST: 21 U/L (ref 5–34)
Albumin: 2.7 g/dL — ABNORMAL LOW (ref 3.5–5.0)
Alkaline Phosphatase: 73 U/L (ref 40–150)
BILIRUBIN TOTAL: 0.55 mg/dL (ref 0.20–1.20)
BUN: 16.4 mg/dL (ref 7.0–26.0)
CALCIUM: 9.5 mg/dL (ref 8.4–10.4)
CHLORIDE: 104 meq/L (ref 98–109)
CO2: 26 meq/L (ref 22–29)
CREATININE: 1.3 mg/dL (ref 0.7–1.3)
Glucose: 113 mg/dl (ref 70–140)
Potassium: 4.4 mEq/L (ref 3.5–5.1)
Sodium: 137 mEq/L (ref 136–145)
Total Protein: 6.1 g/dL — ABNORMAL LOW (ref 6.4–8.3)

## 2013-03-04 LAB — PROTIME-INR
INR: 3 (ref 2.00–3.50)
Protime: 36 Seconds — ABNORMAL HIGH (ref 10.6–13.4)

## 2013-03-04 MED ORDER — ONDANSETRON 16 MG/50ML IVPB (CHCC)
INTRAVENOUS | Status: AC
  Filled 2013-03-04: qty 16

## 2013-03-04 MED ORDER — DEXAMETHASONE SODIUM PHOSPHATE 10 MG/ML IJ SOLN
INTRAMUSCULAR | Status: AC
  Filled 2013-03-04: qty 1

## 2013-03-04 MED ORDER — ONDANSETRON 16 MG/50ML IVPB (CHCC)
16.0000 mg | Freq: Once | INTRAVENOUS | Status: AC
  Administered 2013-03-04: 16 mg via INTRAVENOUS

## 2013-03-04 MED ORDER — FAMOTIDINE IN NACL 20-0.9 MG/50ML-% IV SOLN
INTRAVENOUS | Status: AC
  Filled 2013-03-04: qty 50

## 2013-03-04 MED ORDER — FAMOTIDINE IN NACL 20-0.9 MG/50ML-% IV SOLN
20.0000 mg | Freq: Once | INTRAVENOUS | Status: AC
  Administered 2013-03-04: 20 mg via INTRAVENOUS

## 2013-03-04 MED ORDER — SODIUM CHLORIDE 0.9 % IV SOLN
Freq: Once | INTRAVENOUS | Status: AC
  Administered 2013-03-04: 11:00:00 via INTRAVENOUS

## 2013-03-04 MED ORDER — DIPHENHYDRAMINE HCL 50 MG/ML IJ SOLN
INTRAMUSCULAR | Status: AC
  Filled 2013-03-04: qty 1

## 2013-03-04 MED ORDER — DEXAMETHASONE SODIUM PHOSPHATE 10 MG/ML IJ SOLN
5.0000 mg | Freq: Once | INTRAMUSCULAR | Status: AC
  Administered 2013-03-04: 5 mg via INTRAVENOUS

## 2013-03-04 MED ORDER — DIPHENHYDRAMINE HCL 50 MG/ML IJ SOLN
25.0000 mg | Freq: Once | INTRAMUSCULAR | Status: AC
  Administered 2013-03-04: 25 mg via INTRAVENOUS

## 2013-03-04 MED ORDER — SODIUM CHLORIDE 0.9 % IV SOLN
130.0000 mg | Freq: Once | INTRAVENOUS | Status: AC
  Administered 2013-03-04: 130 mg via INTRAVENOUS
  Filled 2013-03-04: qty 13

## 2013-03-04 MED ORDER — PACLITAXEL CHEMO INJECTION 300 MG/50ML
50.0000 mg/m2 | Freq: Once | INTRAVENOUS | Status: AC
  Administered 2013-03-04: 108 mg via INTRAVENOUS
  Filled 2013-03-04: qty 18

## 2013-03-04 NOTE — Progress Notes (Signed)
Patient has poor venous access.  Discussed PAC with patient and wife.  Showed them our sample.  He is in agreement with PAC placement.  Called Chalmers Cater RN with Dr. Benay Spice and left her a message that patient would like PAC if OK with Dr. Benay Spice.

## 2013-03-04 NOTE — Telephone Encounter (Signed)
Message from Jan, Infusion RN reporting pt has poor venous access. Recommends port a cath. Reviewed with Dr. Benay Spice: Will try to get 3 more cycles of chemo before deciding on port. If pt would rather port sooner, will need to hold Coumadin. No bridge for procedure. Spoke with pt and wife in infusion room, they are OK waiting to see how next few cycles of chemo go before deciding on port.

## 2013-03-04 NOTE — Progress Notes (Signed)
OFFICE PROGRESS NOTE  Interval history:  Mr. Luis Mclean returns for followup of the esophagus cancer. He continues to feel stronger. He is ambulating at home with a walker. He reports a good appetite. He denies pain. No dysphagia. He denies nausea/vomiting. Bowels overall moving regularly. He has occasional loose stools. He denies mouth sores.   Objective: Filed Vitals:   03/04/13 0907  BP: 102/67  Pulse: 108  Temp: 97.2 F (36.2 C)  Resp: 19   No thrush. Lungs clear. Irregular cardiac rhythm. Abdomen soft and nontender. No hepatomegaly. Trace lower leg edema bilaterally. Calves nontender. Right foot drop.   Lab Results: Lab Results  Component Value Date   WBC 5.7 03/04/2013   HGB 12.0* 03/04/2013   HCT 35.9* 03/04/2013   MCV 90.2 03/04/2013   PLT 166 03/04/2013   NEUTROABS 3.0 03/04/2013    Chemistry:    Chemistry      Component Value Date/Time   NA 132* 01/25/2013 1100   NA 127* 01/12/2013 0430   K 4.2 01/25/2013 1100   K 3.7 01/12/2013 0430   CL 99 01/12/2013 0430   CO2 22 01/25/2013 1100   CO2 22 01/12/2013 0430   BUN 19.7 01/25/2013 1100   BUN 18 01/12/2013 0430   CREATININE 1.5* 01/25/2013 1100   CREATININE 1.42* 01/12/2013 0430   GLU 421* 12/06/2012 1503      Component Value Date/Time   CALCIUM 8.9 01/25/2013 1100   CALCIUM 8.1* 01/12/2013 0430   ALKPHOS 47 01/11/2013 0340   ALKPHOS 62 01/03/2013 1439   AST 17 01/11/2013 0340   AST 18 01/03/2013 1439   ALT 6 01/11/2013 0340   ALT 9 01/03/2013 1439   BILITOT 0.3 01/11/2013 0340   BILITOT 0.25 01/03/2013 1439       Studies/Results: No results found.  Medications: I have reviewed the patient's current medications.  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  Initiation of radiation and weekly  Taxol/carboplatin 12/06/2012.  Radiation completed 01/05/2013.  CT 01/25/2013 with resolution of upper abdominal adenopathy and a decreased size of the liver metastasis. 2. Indeterminate right hepatic mass on [id number]-98/92/1194/RDE-10/07/4816 , hypermetabolic on the PET scan 56/31/4970. 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  3. Dysphagia/subxiphoid pain secondary to #1. Improved.  4. History of atrial fibrillation-maintained on Coumadin.  5. Renal insufficiency.  6. History of a CVA.  7. 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  8. Diabetes.  9. Odynophagia. Improved.   Dispositon-he appears stable. Plan to proceed with weekly Taxol/carboplatin beginning today as scheduled (weekly x3 followed by a one-week break).   He was given instructions to adjust the Coumadin dose from 5 mg daily to 5 mg alternating with 2.5 mg. We will repeat a PT/INR in one week.  We will see him in followup in 2 weeks. He will contact the office in the interim with any problems.  Plan reviewed with Dr. Benay Spice.   Luis Mclean ANP/GNP-BC

## 2013-03-04 NOTE — Patient Instructions (Signed)
[facility] Discharge Instructions for Patients Receiving Chemotherapy  Today you received the following chemotherapy agents taxol and carboplatin.  To help prevent nausea and vomiting after your treatment, we encourage you to take your nausea medication compazine.   If you develop nausea and vomiting that is not controlled by your nausea medication, call the clinic.   BELOW ARE SYMPTOMS THAT SHOULD BE REPORTED IMMEDIATELY:  *FEVER GREATER THAN 100.5 F  *CHILLS WITH OR WITHOUT FEVER  NAUSEA AND VOMITING THAT IS NOT CONTROLLED WITH YOUR NAUSEA MEDICATION  *UNUSUAL SHORTNESS OF BREATH  *UNUSUAL BRUISING OR BLEEDING  TENDERNESS IN MOUTH AND THROAT WITH OR WITHOUT PRESENCE OF ULCERS  *URINARY PROBLEMS  *BOWEL PROBLEMS  UNUSUAL RASH Items with * indicate a potential emergency and should be followed up as soon as possible.  Feel free to call the clinic you have any questions or concerns. The clinic phone number is [phone].    

## 2013-03-04 NOTE — Telephone Encounter (Signed)
gv and printed appt sched and avs for pt for Jan and Feb.....sed added tx. °

## 2013-03-06 ENCOUNTER — Other Ambulatory Visit: Payer: Self-pay | Admitting: Oncology

## 2013-03-08 ENCOUNTER — Telehealth: Payer: Self-pay | Admitting: *Deleted

## 2013-03-08 NOTE — Telephone Encounter (Signed)
Asking if Dr. Benay Spice needs any labs drawn this week? This week is her last week for nursing to see him. Will continue to be followed by physical therapy. Per Dr. Benay Spice: No orders-labs will be done at [facility] this week w/tx.

## 2013-03-11 ENCOUNTER — Telehealth: Payer: Self-pay | Admitting: *Deleted

## 2013-03-11 ENCOUNTER — Ambulatory Visit (HOSPITAL_BASED_OUTPATIENT_CLINIC_OR_DEPARTMENT_OTHER): Payer: Medicare Other

## 2013-03-11 ENCOUNTER — Other Ambulatory Visit (HOSPITAL_BASED_OUTPATIENT_CLINIC_OR_DEPARTMENT_OTHER): Payer: Medicare Other

## 2013-03-11 DIAGNOSIS — C155 Malignant neoplasm of lower third of esophagus: Secondary | ICD-10-CM

## 2013-03-11 DIAGNOSIS — Z5111 Encounter for antineoplastic chemotherapy: Secondary | ICD-10-CM

## 2013-03-11 DIAGNOSIS — C159 Malignant neoplasm of esophagus, unspecified: Secondary | ICD-10-CM

## 2013-03-11 DIAGNOSIS — I4891 Unspecified atrial fibrillation: Secondary | ICD-10-CM

## 2013-03-11 DIAGNOSIS — Z7901 Long term (current) use of anticoagulants: Secondary | ICD-10-CM

## 2013-03-11 LAB — CBC WITH DIFFERENTIAL/PLATELET
BASO%: 0.3 % (ref 0.0–2.0)
Basophils Absolute: 0 10*3/uL (ref 0.0–0.1)
EOS%: 3.9 % (ref 0.0–7.0)
Eosinophils Absolute: 0.1 10*3/uL (ref 0.0–0.5)
HCT: 35.2 % — ABNORMAL LOW (ref 38.4–49.9)
HGB: 12.1 g/dL — ABNORMAL LOW (ref 13.0–17.1)
LYMPH%: 35.3 % (ref 14.0–49.0)
MCH: 30.4 pg (ref 27.2–33.4)
MCHC: 34.4 g/dL (ref 32.0–36.0)
MCV: 88.4 fL (ref 79.3–98.0)
MONO#: 0.3 10*3/uL (ref 0.1–0.9)
MONO%: 7.5 % (ref 0.0–14.0)
NEUT#: 1.9 10*3/uL (ref 1.5–6.5)
NEUT%: 53 % (ref 39.0–75.0)
Platelets: 135 10*3/uL — ABNORMAL LOW (ref 140–400)
RBC: 3.98 10*6/uL — ABNORMAL LOW (ref 4.20–5.82)
RDW: 15.2 % — ABNORMAL HIGH (ref 11.0–14.6)
WBC: 3.6 10*3/uL — ABNORMAL LOW (ref 4.0–10.3)
lymph#: 1.3 10*3/uL (ref 0.9–3.3)

## 2013-03-11 LAB — PROTIME-INR
INR: 1.4 — ABNORMAL LOW (ref 2.00–3.50)
Protime: 16.8 Seconds — ABNORMAL HIGH (ref 10.6–13.4)

## 2013-03-11 MED ORDER — DIPHENHYDRAMINE HCL 50 MG/ML IJ SOLN
INTRAMUSCULAR | Status: AC
  Filled 2013-03-11: qty 1

## 2013-03-11 MED ORDER — FAMOTIDINE IN NACL 20-0.9 MG/50ML-% IV SOLN
INTRAVENOUS | Status: AC
  Filled 2013-03-11: qty 50

## 2013-03-11 MED ORDER — ONDANSETRON 16 MG/50ML IVPB (CHCC)
INTRAVENOUS | Status: AC
  Filled 2013-03-11: qty 16

## 2013-03-11 MED ORDER — DEXAMETHASONE SODIUM PHOSPHATE 10 MG/ML IJ SOLN
5.0000 mg | Freq: Once | INTRAMUSCULAR | Status: AC
  Administered 2013-03-11: 5 mg via INTRAVENOUS

## 2013-03-11 MED ORDER — SODIUM CHLORIDE 0.9 % IV SOLN
Freq: Once | INTRAVENOUS | Status: AC
  Administered 2013-03-11: 11:00:00 via INTRAVENOUS

## 2013-03-11 MED ORDER — SODIUM CHLORIDE 0.9 % IV SOLN
130.0000 mg | Freq: Once | INTRAVENOUS | Status: AC
  Administered 2013-03-11: 130 mg via INTRAVENOUS
  Filled 2013-03-11: qty 13

## 2013-03-11 MED ORDER — SODIUM CHLORIDE 0.9 % IV SOLN
50.0000 mg/m2 | Freq: Once | INTRAVENOUS | Status: AC
  Administered 2013-03-11: 108 mg via INTRAVENOUS
  Filled 2013-03-11: qty 18

## 2013-03-11 MED ORDER — ONDANSETRON 16 MG/50ML IVPB (CHCC)
16.0000 mg | Freq: Once | INTRAVENOUS | Status: AC
  Administered 2013-03-11: 16 mg via INTRAVENOUS

## 2013-03-11 MED ORDER — FAMOTIDINE IN NACL 20-0.9 MG/50ML-% IV SOLN
20.0000 mg | Freq: Once | INTRAVENOUS | Status: AC
  Administered 2013-03-11: 20 mg via INTRAVENOUS

## 2013-03-11 MED ORDER — DIPHENHYDRAMINE HCL 50 MG/ML IJ SOLN
25.0000 mg | Freq: Once | INTRAMUSCULAR | Status: AC
  Administered 2013-03-11: 25 mg via INTRAVENOUS

## 2013-03-11 MED ORDER — DEXAMETHASONE SODIUM PHOSPHATE 10 MG/ML IJ SOLN
INTRAMUSCULAR | Status: AC
  Filled 2013-03-11: qty 1

## 2013-03-11 NOTE — Telephone Encounter (Addendum)
Labs reviewed by Dr. Benay Spice: order received to change Coumadin to 5 mg daily except 2.5 mg on Mon+ Thurs. Called pt with instructions to take Coumadin 5 mg daily except 2.5 mg on Mon+Thurs. Pt verbalized appropriate instructions. Will recheck in one week.

## 2013-03-11 NOTE — Patient Instructions (Signed)
[facility] Discharge Instructions for Patients Receiving Chemotherapy  Today you received the following chemotherapy agents Taxol and Carboplatin.  To help prevent nausea and vomiting after your treatment, we encourage you to take your nausea medication if needed.   If you develop nausea and vomiting that is not controlled by your nausea medication, call the clinic.   BELOW ARE SYMPTOMS THAT SHOULD BE REPORTED IMMEDIATELY:  *FEVER GREATER THAN 100.5 F  *CHILLS WITH OR WITHOUT FEVER  NAUSEA AND VOMITING THAT IS NOT CONTROLLED WITH YOUR NAUSEA MEDICATION  *UNUSUAL SHORTNESS OF BREATH  *UNUSUAL BRUISING OR BLEEDING  TENDERNESS IN MOUTH AND THROAT WITH OR WITHOUT PRESENCE OF ULCERS  *URINARY PROBLEMS  *BOWEL PROBLEMS  UNUSUAL RASH Items with * indicate a potential emergency and should be followed up as soon as possible.  Feel free to call the clinic you have any questions or concerns. The clinic phone number is [phone].

## 2013-03-13 ENCOUNTER — Other Ambulatory Visit: Payer: Self-pay | Admitting: Oncology

## 2013-03-18 ENCOUNTER — Other Ambulatory Visit (HOSPITAL_BASED_OUTPATIENT_CLINIC_OR_DEPARTMENT_OTHER): Payer: Medicare Other

## 2013-03-18 ENCOUNTER — Ambulatory Visit (HOSPITAL_BASED_OUTPATIENT_CLINIC_OR_DEPARTMENT_OTHER): Payer: Medicare Other | Admitting: Nurse Practitioner

## 2013-03-18 ENCOUNTER — Telehealth: Payer: Self-pay | Admitting: Oncology

## 2013-03-18 ENCOUNTER — Ambulatory Visit: Payer: Medicare Other

## 2013-03-18 VITALS — BP 93/47 | HR 94 | Temp 96.9°F | Resp 18 | Ht 71.0 in | Wt 183.9 lb

## 2013-03-18 DIAGNOSIS — C159 Malignant neoplasm of esophagus, unspecified: Secondary | ICD-10-CM

## 2013-03-18 DIAGNOSIS — I4891 Unspecified atrial fibrillation: Secondary | ICD-10-CM

## 2013-03-18 DIAGNOSIS — C787 Secondary malignant neoplasm of liver and intrahepatic bile duct: Secondary | ICD-10-CM

## 2013-03-18 DIAGNOSIS — N289 Disorder of kidney and ureter, unspecified: Secondary | ICD-10-CM

## 2013-03-18 DIAGNOSIS — C155 Malignant neoplasm of lower third of esophagus: Secondary | ICD-10-CM

## 2013-03-18 DIAGNOSIS — D702 Other drug-induced agranulocytosis: Secondary | ICD-10-CM

## 2013-03-18 LAB — CBC WITH DIFFERENTIAL/PLATELET
BASO%: 1.4 % (ref 0.0–2.0)
Basophils Absolute: 0 10*3/uL (ref 0.0–0.1)
EOS%: 3.6 % (ref 0.0–7.0)
Eosinophils Absolute: 0.1 10*3/uL (ref 0.0–0.5)
HCT: 34.1 % — ABNORMAL LOW (ref 38.4–49.9)
HGB: 11.6 g/dL — ABNORMAL LOW (ref 13.0–17.1)
LYMPH#: 0.7 10*3/uL — AB (ref 0.9–3.3)
LYMPH%: 43.8 % (ref 14.0–49.0)
MCH: 31.3 pg (ref 27.2–33.4)
MCHC: 33.9 g/dL (ref 32.0–36.0)
MCV: 92.4 fL (ref 79.3–98.0)
MONO#: 0.1 10*3/uL (ref 0.1–0.9)
MONO%: 9.3 % (ref 0.0–14.0)
NEUT%: 41.9 % (ref 39.0–75.0)
NEUTROS ABS: 0.7 10*3/uL — AB (ref 1.5–6.5)
Platelets: 150 10*3/uL (ref 140–400)
RBC: 3.69 10*6/uL — AB (ref 4.20–5.82)
RDW: 15.6 % — AB (ref 11.0–14.6)
WBC: 1.6 10*3/uL — ABNORMAL LOW (ref 4.0–10.3)

## 2013-03-18 LAB — PROTIME-INR
INR: 1.4 — ABNORMAL LOW (ref 2.00–3.50)
PROTIME: 16.8 s — AB (ref 10.6–13.4)

## 2013-03-18 MED ORDER — WARFARIN SODIUM 5 MG PO TABS: 5.0000 mg | ORAL_TABLET | Freq: Every day | ORAL | Status: DC

## 2013-03-18 NOTE — Progress Notes (Signed)
OFFICE PROGRESS NOTE  Interval history:  Mr. Luis Mclean returns for followup of esophagus cancer. He completed cycle 1 week 2 Taxol/carboplatin 03/11/2013. He denies nausea/vomiting. No mouth sores. No diarrhea. He has intermittent mild constipation relieved with a laxative. No change in baseline neuropathy symptoms. He has a good appetite. No pain. He feels somewhat "weak" today. He denies fever or chills. No bleeding.  Objective: Filed Vitals:   03/18/13 0931  BP: 93/47  Pulse: 94  Temp: 96.9 F (36.1 C)  Resp: 18   Repeat manual blood pressure 116/84  Oropharynx is without thrush or ulceration. Lungs are clear. Irregular cardiac rhythm. Abdomen soft and nontender. No hepatomegaly. Trace lower leg edema bilaterally. Calves nontender.   Lab Results: Lab Results  Component Value Date   WBC 1.6* 03/18/2013   HGB 11.6* 03/18/2013   HCT 34.1* 03/18/2013   MCV 92.4 03/18/2013   PLT 150 03/18/2013   NEUTROABS 0.7* 03/18/2013    Chemistry:    Chemistry      Component Value Date/Time   NA 137 03/04/2013 0848   NA 127* 01/12/2013 0430   K 4.4 03/04/2013 0848   K 3.7 01/12/2013 0430   CL 99 01/12/2013 0430   CO2 26 03/04/2013 0848   CO2 22 01/12/2013 0430   BUN 16.4 03/04/2013 0848   BUN 18 01/12/2013 0430   CREATININE 1.3 03/04/2013 0848   CREATININE 1.42* 01/12/2013 0430   GLU 421* 12/06/2012 1503      Component Value Date/Time   CALCIUM 9.5 03/04/2013 0848   CALCIUM 8.1* 01/12/2013 0430   ALKPHOS 73 03/04/2013 0848   ALKPHOS 47 01/11/2013 0340   AST 21 03/04/2013 0848   AST 17 01/11/2013 0340   ALT 10 03/04/2013 0848   ALT 6 01/11/2013 0340   BILITOT 0.55 03/04/2013 0848   BILITOT 0.3 01/11/2013 0340       Studies/Results: No results found.  Medications: I have reviewed the patient's current medications.  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  Initiation of radiation and weekly Taxol/carboplatin 12/06/2012.  Radiation completed 01/05/2013.  CT 01/25/2013 with resolution of upper abdominal adenopathy and a decreased size of the liver metastasis. Weekly Taxol/carboplatin initiated 03/04/2013. 2. Indeterminate right hepatic mass on [id number]-00/37/0488/QBV-69/45/0388 , hypermetabolic on the PET scan 82/80/0349. 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  3. Dysphagia/subxiphoid pain secondary to #1. Improved.  4. History of atrial fibrillation-maintained on Coumadin.  5. Renal insufficiency.  6. History of a CVA.  7. 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  8. Diabetes.  9. Odynophagia. Improved.  10. Neutropenia secondary to chemotherapy.  Dispositon-he appears stable. He has completed 2 weekly treatments with Taxol/carboplatin. He is neutropenic on labs today. We are holding today's treatment. Chemotherapy schedule will be adjusted to 2 weeks on/1 week off beginning 03/25/2013. He will be seen in followup on 04/15/2013. He will contact the office in the interim with any problems. We specifically discussed fever, chills, other signs of infection.  We adjusted the Coumadin dose to 5 mg daily and will obtain a repeat PT/INR in one week.  Plan reviewed with Dr. Benay Spice.  25 minutes were spent face-to-face at today's visit with the majority of that time involved in counseling/coordination of care.   Ned Card ANP/GNP-BC

## 2013-03-18 NOTE — Telephone Encounter (Signed)
gv adn pritned appt scehd and avs forpt for Jan and Feb...sed added tx

## 2013-03-18 NOTE — Patient Instructions (Signed)
Increase coumadin to 5 mg daily.

## 2013-03-25 ENCOUNTER — Ambulatory Visit: Payer: Medicare Other

## 2013-03-25 ENCOUNTER — Other Ambulatory Visit (HOSPITAL_BASED_OUTPATIENT_CLINIC_OR_DEPARTMENT_OTHER): Payer: Medicare Other

## 2013-03-25 DIAGNOSIS — C159 Malignant neoplasm of esophagus, unspecified: Secondary | ICD-10-CM

## 2013-03-25 DIAGNOSIS — C155 Malignant neoplasm of lower third of esophagus: Secondary | ICD-10-CM

## 2013-03-25 DIAGNOSIS — I4891 Unspecified atrial fibrillation: Secondary | ICD-10-CM

## 2013-03-25 LAB — PROTIME-INR
INR: 1.9 — AB (ref 2.00–3.50)
PROTIME: 22.8 s — AB (ref 10.6–13.4)

## 2013-03-25 LAB — CBC WITH DIFFERENTIAL/PLATELET
BASO%: 0.8 % (ref 0.0–2.0)
BASOS ABS: 0 10*3/uL (ref 0.0–0.1)
EOS ABS: 0 10*3/uL (ref 0.0–0.5)
EOS%: 0.8 % (ref 0.0–7.0)
HEMATOCRIT: 33.9 % — AB (ref 38.4–49.9)
HEMOGLOBIN: 11.6 g/dL — AB (ref 13.0–17.1)
LYMPH%: 39.2 % (ref 14.0–49.0)
MCH: 30.7 pg (ref 27.2–33.4)
MCHC: 34.2 g/dL (ref 32.0–36.0)
MCV: 89.7 fL (ref 79.3–98.0)
MONO#: 0.6 10*3/uL (ref 0.1–0.9)
MONO%: 23.5 % — ABNORMAL HIGH (ref 0.0–14.0)
NEUT%: 35.7 % — AB (ref 39.0–75.0)
NEUTROS ABS: 0.9 10*3/uL — AB (ref 1.5–6.5)
PLATELETS: 179 10*3/uL (ref 140–400)
RBC: 3.78 10*6/uL — ABNORMAL LOW (ref 4.20–5.82)
RDW: 15.1 % — AB (ref 11.0–14.6)
WBC: 2.6 10*3/uL — ABNORMAL LOW (ref 4.0–10.3)
lymph#: 1 10*3/uL (ref 0.9–3.3)

## 2013-03-25 NOTE — Progress Notes (Signed)
ANC 0.9 today. Per Dr. Benay Spice, we will hold chemotherapy today and keep appointments as scheduled. Wife and patient met in lobby - given copy of labs, educated about risk of infection with ANC 0.9 and told to come back as scheduled. Patient and wife agreeable to this. Cindi Carbon, RN

## 2013-04-01 ENCOUNTER — Other Ambulatory Visit (HOSPITAL_BASED_OUTPATIENT_CLINIC_OR_DEPARTMENT_OTHER): Payer: Medicare Other

## 2013-04-01 ENCOUNTER — Telehealth: Payer: Self-pay | Admitting: *Deleted

## 2013-04-01 ENCOUNTER — Ambulatory Visit (HOSPITAL_BASED_OUTPATIENT_CLINIC_OR_DEPARTMENT_OTHER): Payer: Medicare Other

## 2013-04-01 VITALS — BP 107/60 | HR 90 | Temp 97.0°F | Resp 18

## 2013-04-01 DIAGNOSIS — Z5111 Encounter for antineoplastic chemotherapy: Secondary | ICD-10-CM

## 2013-04-01 DIAGNOSIS — I4891 Unspecified atrial fibrillation: Secondary | ICD-10-CM

## 2013-04-01 DIAGNOSIS — C155 Malignant neoplasm of lower third of esophagus: Secondary | ICD-10-CM

## 2013-04-01 DIAGNOSIS — C787 Secondary malignant neoplasm of liver and intrahepatic bile duct: Secondary | ICD-10-CM

## 2013-04-01 DIAGNOSIS — C159 Malignant neoplasm of esophagus, unspecified: Secondary | ICD-10-CM

## 2013-04-01 LAB — COMPREHENSIVE METABOLIC PANEL (CC13)
ALK PHOS: 85 U/L (ref 40–150)
ALT: 11 U/L (ref 0–55)
AST: 23 U/L (ref 5–34)
Albumin: 3 g/dL — ABNORMAL LOW (ref 3.5–5.0)
Anion Gap: 7 mEq/L (ref 3–11)
BILIRUBIN TOTAL: 0.38 mg/dL (ref 0.20–1.20)
BUN: 17.8 mg/dL (ref 7.0–26.0)
CO2: 26 mEq/L (ref 22–29)
Calcium: 9.8 mg/dL (ref 8.4–10.4)
Chloride: 102 mEq/L (ref 98–109)
Creatinine: 1.5 mg/dL — ABNORMAL HIGH (ref 0.7–1.3)
GLUCOSE: 256 mg/dL — AB (ref 70–140)
POTASSIUM: 4.6 meq/L (ref 3.5–5.1)
SODIUM: 135 meq/L — AB (ref 136–145)
TOTAL PROTEIN: 6.4 g/dL (ref 6.4–8.3)

## 2013-04-01 LAB — CBC WITH DIFFERENTIAL/PLATELET
BASO%: 0.4 % (ref 0.0–2.0)
BASOS ABS: 0 10*3/uL (ref 0.0–0.1)
EOS ABS: 0.1 10*3/uL (ref 0.0–0.5)
EOS%: 1 % (ref 0.0–7.0)
HCT: 36.6 % — ABNORMAL LOW (ref 38.4–49.9)
HGB: 12.5 g/dL — ABNORMAL LOW (ref 13.0–17.1)
LYMPH%: 24.1 % (ref 14.0–49.0)
MCH: 30.9 pg (ref 27.2–33.4)
MCHC: 34.2 g/dL (ref 32.0–36.0)
MCV: 90.6 fL (ref 79.3–98.0)
MONO#: 0.8 10*3/uL (ref 0.1–0.9)
MONO%: 15.6 % — AB (ref 0.0–14.0)
NEUT%: 58.9 % (ref 39.0–75.0)
NEUTROS ABS: 2.8 10*3/uL (ref 1.5–6.5)
Platelets: 142 10*3/uL (ref 140–400)
RBC: 4.04 10*6/uL — ABNORMAL LOW (ref 4.20–5.82)
RDW: 15.3 % — AB (ref 11.0–14.6)
WBC: 4.8 10*3/uL (ref 4.0–10.3)
lymph#: 1.2 10*3/uL (ref 0.9–3.3)

## 2013-04-01 LAB — PROTIME-INR
INR: 1.6 — ABNORMAL LOW (ref 2.00–3.50)
Protime: 19.2 Seconds — ABNORMAL HIGH (ref 10.6–13.4)

## 2013-04-01 MED ORDER — SODIUM CHLORIDE 0.9 % IV SOLN
50.0000 mg/m2 | Freq: Once | INTRAVENOUS | Status: AC
  Administered 2013-04-01: 108 mg via INTRAVENOUS
  Filled 2013-04-01: qty 18

## 2013-04-01 MED ORDER — SODIUM CHLORIDE 0.9 % IV SOLN
130.0000 mg | Freq: Once | INTRAVENOUS | Status: AC
  Administered 2013-04-01: 130 mg via INTRAVENOUS
  Filled 2013-04-01: qty 13

## 2013-04-01 MED ORDER — DIPHENHYDRAMINE HCL 50 MG/ML IJ SOLN
INTRAMUSCULAR | Status: AC
  Filled 2013-04-01: qty 1

## 2013-04-01 MED ORDER — DEXAMETHASONE SODIUM PHOSPHATE 10 MG/ML IJ SOLN
5.0000 mg | Freq: Once | INTRAMUSCULAR | Status: AC
  Administered 2013-04-01: 5 mg via INTRAVENOUS

## 2013-04-01 MED ORDER — SODIUM CHLORIDE 0.9 % IV SOLN
Freq: Once | INTRAVENOUS | Status: AC
  Administered 2013-04-01: 12:00:00 via INTRAVENOUS

## 2013-04-01 MED ORDER — FAMOTIDINE IN NACL 20-0.9 MG/50ML-% IV SOLN
20.0000 mg | Freq: Once | INTRAVENOUS | Status: AC
  Administered 2013-04-01: 20 mg via INTRAVENOUS

## 2013-04-01 MED ORDER — ONDANSETRON 16 MG/50ML IVPB (CHCC)
16.0000 mg | Freq: Once | INTRAVENOUS | Status: AC
  Administered 2013-04-01: 16 mg via INTRAVENOUS

## 2013-04-01 MED ORDER — DIPHENHYDRAMINE HCL 50 MG/ML IJ SOLN
25.0000 mg | Freq: Once | INTRAMUSCULAR | Status: AC
  Administered 2013-04-01: 25 mg via INTRAVENOUS

## 2013-04-01 MED ORDER — ONDANSETRON 16 MG/50ML IVPB (CHCC)
INTRAVENOUS | Status: AC
  Filled 2013-04-01: qty 16

## 2013-04-01 MED ORDER — DEXAMETHASONE SODIUM PHOSPHATE 10 MG/ML IJ SOLN
INTRAMUSCULAR | Status: AC
  Filled 2013-04-01: qty 1

## 2013-04-01 MED ORDER — FAMOTIDINE IN NACL 20-0.9 MG/50ML-% IV SOLN
INTRAVENOUS | Status: AC
  Filled 2013-04-01: qty 50

## 2013-04-01 NOTE — Telephone Encounter (Signed)
Called and informed patient's wife Benjamine Mola) that patient is to continue same does of coumadin (5mg  daily).  Per Dr. Benay Spice.  Patient's wife verbalized understanding and stated she would inform husband.

## 2013-04-01 NOTE — Patient Instructions (Signed)
[facility] Discharge Instructions for Patients Receiving Chemotherapy  Today you received the following chemotherapy agents :  Taxol, Carboplatin.  To help prevent nausea and vomiting after your treatment, we encourage you to take your nausea medication as instructed by your physician.   If you develop nausea and vomiting that is not controlled by your nausea medication, call the clinic.   BELOW ARE SYMPTOMS THAT SHOULD BE REPORTED IMMEDIATELY:  *FEVER GREATER THAN 100.5 F  *CHILLS WITH OR WITHOUT FEVER  NAUSEA AND VOMITING THAT IS NOT CONTROLLED WITH YOUR NAUSEA MEDICATION  *UNUSUAL SHORTNESS OF BREATH  *UNUSUAL BRUISING OR BLEEDING  TENDERNESS IN MOUTH AND THROAT WITH OR WITHOUT PRESENCE OF ULCERS  *URINARY PROBLEMS  *BOWEL PROBLEMS  UNUSUAL RASH Items with * indicate a potential emergency and should be followed up as soon as possible.  Feel free to call the clinic you have any questions or concerns. The clinic phone number is [phone].    

## 2013-04-01 NOTE — Telephone Encounter (Signed)
Left Message for patient to call back regarding lab results.   

## 2013-04-07 ENCOUNTER — Encounter (HOSPITAL_COMMUNITY): Payer: Self-pay | Admitting: Emergency Medicine

## 2013-04-07 ENCOUNTER — Inpatient Hospital Stay (HOSPITAL_COMMUNITY)
Admission: EM | Admit: 2013-04-07 | Discharge: 2013-04-11 | DRG: 595 | Disposition: A | Discharge status: Home / Self Care | Payer: Medicare Other | Attending: Internal Medicine | Admitting: Internal Medicine

## 2013-04-07 ENCOUNTER — Emergency Department (HOSPITAL_COMMUNITY): Payer: Medicare Other

## 2013-04-07 DIAGNOSIS — R112 Nausea with vomiting, unspecified: Secondary | ICD-10-CM | POA: Diagnosis present

## 2013-04-07 DIAGNOSIS — Z7901 Long term (current) use of anticoagulants: Secondary | ICD-10-CM

## 2013-04-07 DIAGNOSIS — E785 Hyperlipidemia, unspecified: Secondary | ICD-10-CM | POA: Diagnosis present

## 2013-04-07 DIAGNOSIS — C787 Secondary malignant neoplasm of liver and intrahepatic bile duct: Secondary | ICD-10-CM | POA: Diagnosis present

## 2013-04-07 DIAGNOSIS — N183 Chronic kidney disease, stage 3 unspecified: Secondary | ICD-10-CM | POA: Diagnosis present

## 2013-04-07 DIAGNOSIS — R21 Rash and other nonspecific skin eruption: Secondary | ICD-10-CM | POA: Diagnosis present

## 2013-04-07 DIAGNOSIS — B029 Zoster without complications: Principal | ICD-10-CM | POA: Diagnosis present

## 2013-04-07 DIAGNOSIS — E039 Hypothyroidism, unspecified: Secondary | ICD-10-CM | POA: Diagnosis present

## 2013-04-07 DIAGNOSIS — R5383 Other fatigue: Secondary | ICD-10-CM

## 2013-04-07 DIAGNOSIS — T451X5A Adverse effect of antineoplastic and immunosuppressive drugs, initial encounter: Secondary | ICD-10-CM | POA: Diagnosis present

## 2013-04-07 DIAGNOSIS — I129 Hypertensive chronic kidney disease with stage 1 through stage 4 chronic kidney disease, or unspecified chronic kidney disease: Secondary | ICD-10-CM | POA: Diagnosis present

## 2013-04-07 DIAGNOSIS — K5900 Constipation, unspecified: Secondary | ICD-10-CM | POA: Diagnosis present

## 2013-04-07 DIAGNOSIS — Z794 Long term (current) use of insulin: Secondary | ICD-10-CM

## 2013-04-07 DIAGNOSIS — I4891 Unspecified atrial fibrillation: Secondary | ICD-10-CM | POA: Diagnosis present

## 2013-04-07 DIAGNOSIS — R1110 Vomiting, unspecified: Secondary | ICD-10-CM

## 2013-04-07 DIAGNOSIS — E119 Type 2 diabetes mellitus without complications: Secondary | ICD-10-CM | POA: Diagnosis present

## 2013-04-07 DIAGNOSIS — R531 Weakness: Secondary | ICD-10-CM

## 2013-04-07 DIAGNOSIS — Z8673 Personal history of transient ischemic attack (TIA), and cerebral infarction without residual deficits: Secondary | ICD-10-CM

## 2013-04-07 DIAGNOSIS — M216X9 Other acquired deformities of unspecified foot: Secondary | ICD-10-CM | POA: Diagnosis present

## 2013-04-07 DIAGNOSIS — E860 Dehydration: Secondary | ICD-10-CM

## 2013-04-07 DIAGNOSIS — R5381 Other malaise: Secondary | ICD-10-CM | POA: Diagnosis present

## 2013-04-07 DIAGNOSIS — K219 Gastro-esophageal reflux disease without esophagitis: Secondary | ICD-10-CM | POA: Diagnosis present

## 2013-04-07 DIAGNOSIS — D72819 Decreased white blood cell count, unspecified: Secondary | ICD-10-CM

## 2013-04-07 DIAGNOSIS — E871 Hypo-osmolality and hyponatremia: Secondary | ICD-10-CM | POA: Diagnosis present

## 2013-04-07 DIAGNOSIS — R509 Fever, unspecified: Secondary | ICD-10-CM | POA: Diagnosis present

## 2013-04-07 DIAGNOSIS — Z87891 Personal history of nicotine dependence: Secondary | ICD-10-CM

## 2013-04-07 DIAGNOSIS — C155 Malignant neoplasm of lower third of esophagus: Secondary | ICD-10-CM | POA: Diagnosis present

## 2013-04-07 DIAGNOSIS — Z923 Personal history of irradiation: Secondary | ICD-10-CM

## 2013-04-07 DIAGNOSIS — C159 Malignant neoplasm of esophagus, unspecified: Secondary | ICD-10-CM

## 2013-04-07 DIAGNOSIS — I951 Orthostatic hypotension: Secondary | ICD-10-CM

## 2013-04-07 DIAGNOSIS — D61810 Antineoplastic chemotherapy induced pancytopenia: Secondary | ICD-10-CM | POA: Diagnosis present

## 2013-04-07 DIAGNOSIS — R1310 Dysphagia, unspecified: Secondary | ICD-10-CM | POA: Diagnosis present

## 2013-04-07 DIAGNOSIS — G589 Mononeuropathy, unspecified: Secondary | ICD-10-CM | POA: Diagnosis present

## 2013-04-07 LAB — COMPREHENSIVE METABOLIC PANEL
ALBUMIN: 2.6 g/dL — AB (ref 3.5–5.2)
ALT: 9 U/L (ref 0–53)
AST: 21 U/L (ref 0–37)
Alkaline Phosphatase: 66 U/L (ref 39–117)
BILIRUBIN TOTAL: 0.7 mg/dL (ref 0.3–1.2)
BUN: 27 mg/dL — AB (ref 6–23)
CHLORIDE: 95 meq/L — AB (ref 96–112)
CO2: 22 mEq/L (ref 19–32)
Calcium: 8.6 mg/dL (ref 8.4–10.5)
Creatinine, Ser: 1.46 mg/dL — ABNORMAL HIGH (ref 0.50–1.35)
GFR calc Af Amer: 50 mL/min — ABNORMAL LOW (ref >90)
GFR, EST NON AFRICAN AMERICAN: 43 mL/min — AB (ref >90)
Glucose, Bld: 181 mg/dL — ABNORMAL HIGH (ref 70–99)
Potassium: 4.8 mEq/L (ref 3.7–5.3)
Sodium: 128 mEq/L — ABNORMAL LOW (ref 137–147)
Total Protein: 5.5 g/dL — ABNORMAL LOW (ref 6.0–8.3)

## 2013-04-07 LAB — URINALYSIS, ROUTINE W REFLEX MICROSCOPIC
BILIRUBIN URINE: NEGATIVE
Glucose, UA: 250 mg/dL — AB
HGB URINE DIPSTICK: NEGATIVE
KETONES UR: NEGATIVE mg/dL
Leukocytes, UA: NEGATIVE
NITRITE: NEGATIVE
PH: 6.5 (ref 5.0–8.0)
Protein, ur: 100 mg/dL — AB
Specific Gravity, Urine: 1.02 (ref 1.005–1.030)
Urobilinogen, UA: 1 mg/dL (ref 0.0–1.0)

## 2013-04-07 LAB — URINE MICROSCOPIC-ADD ON

## 2013-04-07 LAB — CBC WITH DIFFERENTIAL/PLATELET
BASOS PCT: 0 % (ref 0–1)
Basophils Absolute: 0 10*3/uL (ref 0.0–0.1)
EOS ABS: 0 10*3/uL (ref 0.0–0.7)
EOS PCT: 0 % (ref 0–5)
HEMATOCRIT: 32.7 % — AB (ref 39.0–52.0)
Hemoglobin: 11.4 g/dL — ABNORMAL LOW (ref 13.0–17.0)
Lymphocytes Relative: 9 % — ABNORMAL LOW (ref 12–46)
Lymphs Abs: 0.4 10*3/uL — ABNORMAL LOW (ref 0.7–4.0)
MCH: 31.1 pg (ref 26.0–34.0)
MCHC: 34.9 g/dL (ref 30.0–36.0)
MCV: 89.1 fL (ref 78.0–100.0)
MONO ABS: 0.2 10*3/uL (ref 0.1–1.0)
Monocytes Relative: 4 % (ref 3–12)
NEUTROS PCT: 87 % — AB (ref 43–77)
Neutro Abs: 4 10*3/uL (ref 1.7–7.7)
Platelets: 93 10*3/uL — ABNORMAL LOW (ref 150–400)
RBC: 3.67 MIL/uL — AB (ref 4.22–5.81)
RDW: 14.6 % (ref 11.5–15.5)
WBC: 4.6 10*3/uL (ref 4.0–10.5)

## 2013-04-07 LAB — PROTIME-INR
INR: 2.59 — ABNORMAL HIGH (ref 0.00–1.49)
PROTHROMBIN TIME: 26.9 s — AB (ref 11.6–15.2)

## 2013-04-07 LAB — CG4 I-STAT (LACTIC ACID)
LACTIC ACID, VENOUS: 1.65 mmol/L (ref 0.5–2.2)
Lactic Acid, Venous: 0.96 mmol/L (ref 0.5–2.2)

## 2013-04-07 LAB — GLUCOSE, CAPILLARY
Glucose-Capillary: 256 mg/dL — ABNORMAL HIGH (ref 70–99)
Glucose-Capillary: 190 mg/dL — ABNORMAL HIGH (ref 70–99)

## 2013-04-07 MED ORDER — ACETAMINOPHEN 650 MG RE SUPP: 650.0000 mg | Freq: Four times a day (QID) | RECTAL | Status: DC | PRN

## 2013-04-07 MED ORDER — HYDROCORTISONE 0.5 % EX CREA
TOPICAL_CREAM | Freq: Two times a day (BID) | CUTANEOUS | Status: DC | PRN
  Filled 2013-04-07: qty 28.35

## 2013-04-07 MED ORDER — VALACYCLOVIR HCL 500 MG PO TABS
1000.0000 mg | ORAL_TABLET | Freq: Three times a day (TID) | ORAL | Status: DC
  Administered 2013-04-07 – 2013-04-11 (×13): 1000 mg via ORAL
  Filled 2013-04-07 (×15): qty 2

## 2013-04-07 MED ORDER — WARFARIN SODIUM 5 MG PO TABS
5.0000 mg | ORAL_TABLET | Freq: Once | ORAL | Status: AC
  Administered 2013-04-07: 5 mg via ORAL
  Filled 2013-04-07: qty 1

## 2013-04-07 MED ORDER — SUCRALFATE 1 G PO TABS
1.0000 g | ORAL_TABLET | Freq: Four times a day (QID) | ORAL | Status: DC
  Administered 2013-04-07: 1 g via ORAL
  Filled 2013-04-07 (×7): qty 1

## 2013-04-07 MED ORDER — VALACYCLOVIR HCL 500 MG PO TABS: 1000.0000 mg | ORAL_TABLET | Freq: Three times a day (TID) | ORAL | Status: DC

## 2013-04-07 MED ORDER — ENOXAPARIN SODIUM 40 MG/0.4ML ~~LOC~~ SOLN
40.0000 mg | Freq: Every day | SUBCUTANEOUS | Status: DC
  Filled 2013-04-07: qty 0.4

## 2013-04-07 MED ORDER — INSULIN ASPART 100 UNIT/ML ~~LOC~~ SOLN: 0.0000 [IU] | SUBCUTANEOUS | Status: DC

## 2013-04-07 MED ORDER — ACETAMINOPHEN 325 MG PO TABS
650.0000 mg | ORAL_TABLET | Freq: Four times a day (QID) | ORAL | Status: DC | PRN
  Administered 2013-04-07 – 2013-04-11 (×3): 650 mg via ORAL
  Filled 2013-04-07 (×3): qty 2

## 2013-04-07 MED ORDER — WARFARIN - PHARMACIST DOSING INPATIENT: Freq: Every day | Status: DC

## 2013-04-07 MED ORDER — SODIUM CHLORIDE 0.9 % IV SOLN
1000.0000 mL | Freq: Once | INTRAVENOUS | Status: AC
  Administered 2013-04-07: 1000 mL via INTRAVENOUS

## 2013-04-07 MED ORDER — GABAPENTIN 300 MG PO CAPS
300.0000 mg | ORAL_CAPSULE | Freq: Every day | ORAL | Status: DC
  Administered 2013-04-07: 300 mg via ORAL
  Filled 2013-04-07 (×2): qty 1

## 2013-04-07 MED ORDER — ASPIRIN EC 81 MG PO TBEC
81.0000 mg | DELAYED_RELEASE_TABLET | Freq: Every day | ORAL | Status: DC
  Administered 2013-04-07 – 2013-04-11 (×5): 81 mg via ORAL
  Filled 2013-04-07 (×5): qty 1

## 2013-04-07 MED ORDER — GABAPENTIN 600 MG PO TABS
300.0000 mg | ORAL_TABLET | Freq: Every day | ORAL | Status: DC
  Filled 2013-04-07: qty 0.5

## 2013-04-07 MED ORDER — INSULIN GLARGINE 100 UNIT/ML ~~LOC~~ SOLN
12.0000 [IU] | Freq: Every day | SUBCUTANEOUS | Status: DC
  Administered 2013-04-07 – 2013-04-10 (×4): 12 [IU] via SUBCUTANEOUS
  Filled 2013-04-07 (×6): qty 0.12

## 2013-04-07 MED ORDER — ACYCLOVIR SODIUM 50 MG/ML IV SOLN
10.0000 mg/kg | Freq: Once | INTRAVENOUS | Status: AC
  Administered 2013-04-07: 830 mg via INTRAVENOUS
  Filled 2013-04-07: qty 16.6

## 2013-04-07 MED ORDER — PANTOPRAZOLE SODIUM 40 MG PO TBEC
40.0000 mg | DELAYED_RELEASE_TABLET | Freq: Every day | ORAL | Status: DC
  Administered 2013-04-07 – 2013-04-11 (×5): 40 mg via ORAL
  Filled 2013-04-07 (×5): qty 1

## 2013-04-07 MED ORDER — SODIUM CHLORIDE 0.9 % IV SOLN
1000.0000 mL | INTRAVENOUS | Status: DC
  Administered 2013-04-07 – 2013-04-09 (×7): 1000 mL via INTRAVENOUS

## 2013-04-07 MED ORDER — PROCHLORPERAZINE MALEATE 5 MG PO TABS
5.0000 mg | ORAL_TABLET | Freq: Four times a day (QID) | ORAL | Status: DC | PRN
  Administered 2013-04-08 (×2): 5 mg via ORAL
  Filled 2013-04-07 (×3): qty 1

## 2013-04-07 MED ORDER — LEVOTHYROXINE SODIUM 112 MCG PO TABS
112.0000 ug | ORAL_TABLET | Freq: Every day | ORAL | Status: DC
  Administered 2013-04-08 – 2013-04-10 (×3): 112 ug via ORAL
  Filled 2013-04-07 (×5): qty 1

## 2013-04-07 MED ORDER — INSULIN ASPART 100 UNIT/ML ~~LOC~~ SOLN
0.0000 [IU] | SUBCUTANEOUS | Status: DC
  Administered 2013-04-07: 3 [IU] via SUBCUTANEOUS
  Administered 2013-04-07: 8 [IU] via SUBCUTANEOUS
  Administered 2013-04-08: 2 [IU] via SUBCUTANEOUS
  Administered 2013-04-08: 3 [IU] via SUBCUTANEOUS

## 2013-04-07 MED ORDER — SIMVASTATIN 20 MG PO TABS
20.0000 mg | ORAL_TABLET | Freq: Every evening | ORAL | Status: DC
  Administered 2013-04-07 – 2013-04-10 (×4): 20 mg via ORAL
  Filled 2013-04-07 (×5): qty 1

## 2013-04-07 MED ORDER — OXYCODONE HCL 5 MG PO TABS
5.0000 mg | ORAL_TABLET | ORAL | Status: DC | PRN
  Administered 2013-04-11: 5 mg via ORAL
  Filled 2013-04-07: qty 1

## 2013-04-07 MED ORDER — ALPRAZOLAM 0.25 MG PO TABS
0.2500 mg | ORAL_TABLET | Freq: Every evening | ORAL | Status: DC | PRN
  Administered 2013-04-07 – 2013-04-10 (×4): 0.25 mg via ORAL
  Filled 2013-04-07 (×4): qty 1

## 2013-04-07 NOTE — ED Notes (Signed)
Per PTAR report: pt from home: pt has not been feeling well since Sunday and it has gradually been getting worse.  Pt had fever of 102 at 4am this morning.  Pt took tylenol.  Pt has shingles on the right side near the groin area. Pt c/o nausea and small amounts of emesis this morning.  Pt reports increasing weakness.  Pt reports falling Wednesday but did not have any injuries.  Pt hx of diabetes and hasn't had this insulin d/t not feeling well.  PTAR cbg: 151. PTAR VS: BP: 102/60, HR: 72, RR: 16, 97% RA. Pt a/o x 4.

## 2013-04-07 NOTE — Progress Notes (Signed)
Pt arrived to room 1441 from ED on stretcher. Pt A&Ox4. VSS. No c/o at present. Pt and wife oriented to callbell and environment. Pt currently sitting up in bed, eating lunch.

## 2013-04-07 NOTE — H&P (Signed)
[facility] History and Physical  Luis Mclean NTI:[id number] DOB: 1930-01-20 DOA: 04/07/2013  Referring physician:  PCP: Doree Fudge, MD  Specialists:   Chief Complaint: Shingles  HPI: Luis Mclean is a 80 y.o. 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 presents with fever, malaise, nausea, vomiting. His wife diagnosed with shingles last night and had a cycle of your colon by the PCP. He has history of esophageal cancer with metastases to the liver. He is currently on chemotherapy, last session last week.  He attempts of 102 at home. He's had a few episodes of vomiting this morning. He is afebrile here, but had antipyretics within the past 6 hours. Hears pulses mildly tachycardic between 95 and 105. His blood pressure is soft with systolic in the 86P and diastolics in the 61P and 50D. His lungs are clear, he is mentating well and relaxing constantly. Patient does have shingles rash on his right groin and lateral hip and buttock area. There is no other breath other rash.  Sepsis protocol initiated with blood and urine cultures. I do not feel he needs a lumbar puncture at this time as he is mentating well and having no headaches. His set of antibiotics initially, also with acyclovir as you can see shingles this is worse. If we find a UTI or pneumonia, we will initiate antibiotics. Patient states fell multiple times yesterday, wife caught him on one of the occasions and help them down. Negative LOC. Negative head trauma     Review of Systems: The patient denies anorexia, fever, weight loss,, vision loss, decreased hearing, hoarseness, chest pain, syncope, dyspnea on exertion, peripheral edema, balance deficits, hemoptysis, abdominal pain, melena, hematochezia, severe indigestion/heartburn, hematuria, incontinence, genital sores, muscle weakness, suspicious skin lesions, transient blindness, difficulty  walking, depression, unusual weight change, abnormal bleeding, enlarged lymph nodes, angioedema, and breast masses.    TRAVEL HISTORY:   Procedure   Antibiotics   Consultation    Past Medical History  Diagnosis Date  . Hyperlipidemia   . Hypertension   . Stroke   . GERD (gastroesophageal reflux disease)   . Cyst     back of neck  . Esophageal cancer 11/08/12    squmous cell and focal glandular diff  . Diabetes mellitus     type II  . A-fib   . Thyroid disease    Past Surgical History  Procedure Laterality Date  . Cholecystectomy  1989  . Rectal fissure repair  1985  . Back surgery  1989  . Esophageal biopsy  11/08/12    squamous cell & focal glandular diff   Social History:  reports that he quit smoking about 15 years ago. His smoking use included Cigars. He has quit using smokeless tobacco. He reports that he does not drink alcohol or use illicit drugs. where does patient live--home, ALF, SNF? Home with wife Can patient participate in ADLs?   No Known Allergies  Family History  Problem Relation Age of Onset  . Cancer Brother     colon  . Cancer Brother     bone  . Cancer Brother     kidney/mets to bone     Prior to Admission medications   Medication Sig Start Date End Date Taking? Authorizing Provider  acetaminophen (TYLENOL) 500 MG tablet Take 1,000 mg by mouth every 6 (six) hours as needed for mild pain or moderate pain.   Yes Historical Provider, MD  ALPRAZolam (XANAX) 0.25 MG tablet Take 0.25 mg by mouth at bedtime as needed for anxiety or sleep.   Yes Historical Provider, MD  gabapentin (NEURONTIN) 300 MG capsule Take 300 mg by mouth at bedtime.    Yes Historical Provider, MD  HYDROcodone-acetaminophen (NORCO/VICODIN) 5-325 MG per tablet Take 0.5-1 tablets by mouth every 4 (four) hours as needed for moderate pain.   Yes Historical Provider, MD  insulin glargine (LANTUS) 100 UNIT/ML injection Inject 0.12 mLs (12 Units total) into the skin at bedtime.  01/13/13  Yes Janece Canterbury, MD  levothyroxine (SYNTHROID, LEVOTHROID) 112 MCG tablet Take 112 mcg by mouth daily before breakfast.   Yes Historical Provider, MD  omeprazole (PRILOSEC) 20 MG capsule Take 1 capsule (20 mg total) by mouth 2 (two) times daily before a meal. 01/13/13  Yes Janece Canterbury, MD  prochlorperazine (COMPAZINE) 5 MG tablet Take 1 tablet (5 mg total) by mouth every 6 (six) hours as needed for nausea. 11/24/12  Yes Ladell Pier, MD  simvastatin (ZOCOR) 40 MG tablet Take 20 mg by mouth every evening.    Yes Historical Provider, MD  sucralfate (CARAFATE) 1 G tablet Take 1 tablet (1 g total) by mouth 4 (four) times daily. 12/24/12  Yes Marye Round, MD  valACYclovir (VALTREX) 1000 MG tablet Take 1,000 mg by mouth 3 (three) times daily.   Yes Historical Provider, MD  warfarin (COUMADIN) 5 MG tablet Take 1 tablet (5 mg total) by mouth daily. Or as directed by MD. 03/18/13  Yes Owens Shark, NP   Physical Exam: Filed Vitals:   04/07/13 0930 04/07/13 1030 04/07/13 1230 04/07/13 1342  BP: 126/68 121/78 136/76 153/93  Pulse: 88 96 94 101  Temp:    97.8 F (36.6 C)  TempSrc:    Oral  Resp: 23 21 21 18   Height:    6' (1.829 m)  Weight:    82.4 kg (181 lb 10.5 oz)  SpO2: 98% 97% 97% 100%     General:  A./O. x4, NAD  Eyes: He was equal reactive to light and accommodation   Neck: Negative lymphadenopathy negative JVD  Cardiovascular: Regular rhythm and rate, negative murmurs rubs or gallops, DP/PT pulse plus one  Respiratory: To auscultation  Abdomen: Soft, nontender, nondistended, plus bowel  Skin: Rash consistent with zoster on right L1 dermatome; no other rash observed  Musculoskeletal: Good pedal edema  Neurologic: Cranial nerves II through XII intact   Labs on Admission:  Basic Metabolic Panel:  Recent Labs Lab 04/01/13 0951 04/07/13 0708  NA 135* 128*  K 4.6 4.8  CL  --  95*  CO2 26 22  GLUCOSE 256* 181*  BUN 17.8 27*  CREATININE 1.5* 1.46*   CALCIUM 9.8 8.6   Liver Function Tests:  Recent Labs Lab 04/01/13 0951 04/07/13 0708  AST 23 21  ALT 11 9  ALKPHOS 85 66  BILITOT 0.38 0.7  PROT 6.4 5.5*  ALBUMIN 3.0* 2.6*   No results found for this basename: LIPASE, AMYLASE,  in the last 168 hours No results found for this basename: AMMONIA,  in the last 168 hours CBC:  Recent Labs Lab 04/01/13 0950 04/07/13 0708  WBC 4.8 4.6  NEUTROABS 2.8 4.0  HGB 12.5* 11.4*  HCT 36.6* 32.7*  MCV 90.6 89.1  PLT 142 93*   Cardiac Enzymes: No results found for this basename: CKTOTAL, CKMB, CKMBINDEX, TROPONINI,  in the last 168 hours  BNP (last 3 results) No results found for this  basename: PROBNP,  in the last 8760 hours CBG: No results found for this basename: GLUCAP,  in the last 168 hours  Radiological Exams on Admission: Dg Chest Port 1 View  04/07/2013   CLINICAL DATA:  Shortness of breath. Fever. Hypertension. Esophageal cancer  EXAM: PORTABLE CHEST - 1 VIEW  COMPARISON:  DG CHEST 2 VIEW dated 01/09/2013; NM PET IMAGE INITIAL (PI) SKULL BASE TO THIGH dated 12/02/2012  FINDINGS: Mild atherosclerotic calcification of the aortic arch. Heart size within normal limits for projection. The lungs appear clear. Thoracic spondylosis is present.  IMPRESSION: 1. Mild atherosclerotic calcification of the aortic arch. No acute findings.   Electronically Signed   By: Sherryl Barters M.D.   On: 04/07/2013 07:32    EKG:   Assessment/Plan Principal Problem:   Shingles outbreak Active Problems:   Malignant neoplasm of lower third of esophagus   Atrial fibrillation with RVR   Hyponatremia   Dehydration   Chronic kidney disease, stage 3   DM type 2 (diabetes mellitus, type 2)   Metastatic adenocarcinoma to liver   Zoster   Shingles -Continue valacyclovir -Consider hydrocortisone topical cream PRN -Continue Neurontin  Malignant neoplasm lower third esophagus -Consult oncology (Dr. Lisbeth Renshaw) in the a.m. to let him know patient has  been admitted -Stable per patient last XRT November 2014, last chemotherapy 6 February -Obtain blood cultures -Obtain urine culture -Low threshold for starting empiric antibiotic (fever/leukopenia)   Metastatic adenocarcinoma to liver -See malignant neoplasm esophagus  Atrial fibrillation with RVR -Coumadin per pharmacy   Diabetes type 2 -Sensitive SSI  HLD -Continue Zocor 20 mg -Obtain lipid panel  Hypothyroidism -Continue Synthroid 144mcg  Hyponatremia -Most likely secondary dehydration continue normal saline    Code Status: Full Family Communication: Wife and son present at bedside for discussion of plan of care Disposition Plan: Resolution shingles  Time spent: 53 minute  Allie Bossier [facility] Pager [phone]  If 7PM-7AM, please contact night-coverage [email or web address] Password [facility] 04/07/2013, 8:38 PM

## 2013-04-07 NOTE — Progress Notes (Signed)
ANTICOAGULATION CONSULT NOTE - Initial Consult  Pharmacy Consult for warfarin Indication: atrial fibrillation  No Known Allergies  Patient Measurements: Height: 6' (182.9 cm) Weight: 183 lb (83.008 kg) IBW/kg (Calculated) : 77.6   Vital Signs: Temp: 98 F (36.7 C) (02/12 0750) Temp src: Oral (02/12 0750) BP: 136/76 mmHg (02/12 1230) Pulse Rate: 94 (02/12 1230)  Labs:  Recent Labs  04/07/13 0708 04/07/13 1130  HGB 11.4*  --   HCT 32.7*  --   PLT 93*  --   LABPROT  --  26.9*  INR  --  2.59*  CREATININE 1.46*  --     Estimated Creatinine Clearance: 43.6 ml/min (by C-G formula based on Cr of 1.46).   Medical History: Past Medical History  Diagnosis Date  . Hyperlipidemia   . Hypertension   . Stroke   . GERD (gastroesophageal reflux disease)   . Cyst     back of neck  . Esophageal cancer 11/08/12    squmous cell and focal glandular diff  . Diabetes mellitus     type II  . A-fib   . Thyroid disease     Medications:  Scheduled:  . aspirin EC  81 mg Oral Daily  . gabapentin  300 mg Oral QHS  . insulin glargine  12 Units Subcutaneous QHS  . [START ON 04/08/2013] levothyroxine  112 mcg Oral QAC breakfast  . pantoprazole  40 mg Oral Daily  . simvastatin  20 mg Oral QPM  . sucralfate  1 g Oral QID  . valACYclovir  1,000 mg Oral TID   Infusions:  . sodium chloride 1,000 mL (04/07/13 0804)   PRN: acetaminophen, acetaminophen, ALPRAZolam, oxyCODONE, prochlorperazine  Assessment: 80 y/o M on chronic warfarin therapy for atrial fibrillation and hx of CVA, on chemotherapy for esophageal cancer with liver metastases, presented to ED with fever, weakness, and worsening herpes zoster lesions.   Orders received for pharmacy to assist with inpatient warfarin dosing.    Most recent warfarin dosage prior to admission was 5 mg daily, last taken on 2/11.   INR on admission is therapeutic (2.59).   Concomitant low-dose ASA 81 mg daily is noted.  Goal of Therapy:  INR  2-3    Plan:  1. Warfarin 5 mg PO x 1 today at 1800. 2. Daily PT / INR while inpatient. 3. Follow clinical course.  Clayburn Pert, PharmD, BCPS Pager: [phone] 04/07/2013  12:36 PM

## 2013-04-07 NOTE — ED Notes (Signed)
Bed: TD42 Expected date: 04/07/13 Expected time: 6:19 AM Means of arrival: Ambulance Comments: 80 yo M  Fever, weakness, shingles

## 2013-04-07 NOTE — ED Provider Notes (Signed)
CSN: [id number]     Arrival date & time 04/07/13  [id number] History   First MD Initiated Contact with Patient 04/07/13 [phone]     Chief Complaint  Patient presents with  . Fever  . Weakness  . Herpes Zoster     (Consider location/radiation/quality/duration/timing/severity/associated sxs/prior Treatment) Patient is a 80 y.o. male presenting with fever and weakness. The history is provided by the patient.  Fever Max temp prior to arrival:  102 Temp source:  Oral Severity:  Moderate Onset quality:  Gradual Duration:  3 days Timing:  Constant Progression:  Worsening Chronicity:  New Relieved by:  Nothing Worsened by:  Nothing tried Associated symptoms: chills, nausea, rash (shingles - started on PO acyclovir last night, Rx called in by PCP.) and vomiting   Associated symptoms: no chest pain and no cough   Weakness Pertinent negatives include no chest pain, no abdominal pain and no shortness of breath.    Past Medical History  Diagnosis Date  . Hyperlipidemia   . Hypertension   . Stroke   . GERD (gastroesophageal reflux disease)   . Cyst     back of neck  . Esophageal cancer 11/08/12    squmous cell and focal glandular diff  . Diabetes mellitus     type II  . A-fib   . Thyroid disease    Past Surgical History  Procedure Laterality Date  . Cholecystectomy  1989  . Rectal fissure repair  1985  . Back surgery  1989  . Esophageal biopsy  11/08/12    squamous cell & focal glandular diff   Family History  Problem Relation Age of Onset  . Cancer Brother     colon  . Cancer Brother     bone  . Cancer Brother     kidney/mets to bone   History  Substance Use Topics  . Smoking status: Former Smoker -- 30 years    Types: Cigars    Quit date: 05/05/1997  . Smokeless tobacco: Former Systems developer  . Alcohol Use: No    Review of Systems  Constitutional: Positive for fever (102) and chills.  Respiratory: Negative for cough and shortness of breath.   Cardiovascular: Negative for  chest pain and leg swelling.  Gastrointestinal: Positive for nausea and vomiting. Negative for abdominal pain.  Skin: Positive for rash (shingles - started on PO acyclovir last night, Rx called in by PCP.).  Neurological: Positive for weakness.  All other systems reviewed and are negative.      Allergies  Review of patient's allergies indicates no known allergies.  Home Medications   Current Outpatient Rx  Name  Route  Sig  Dispense  Refill  . docusate sodium 100 MG CAPS   Oral   Take 100 mg by mouth 2 (two) times daily.   10 capsule   0   . gabapentin (NEURONTIN) 300 MG capsule   Oral   Take 300 mg by mouth at bedtime. 100 mg twice daily and 300 mg at night         . HYDROcodone-acetaminophen (NORCO/VICODIN) 5-325 MG per tablet   Oral   Take 0.5-1 tablets by mouth every 4 (four) hours as needed for moderate pain or severe pain. 1/2 -1 by mouth every 4 hours as needed for moderate to severe pain DO NOT EXCEED 4 GM OF TYLENOL IN 24 HOURS   180 tablet   0   . insulin glargine (LANTUS) 100 UNIT/ML injection   Subcutaneous   Inject 0.12 mLs (  12 Units total) into the skin at bedtime.   10 mL   12   . lactose free nutrition (BOOST PLUS) LIQD   Oral   Take 237 mLs by mouth 2 (two) times daily between meals.      0   . levothyroxine (SYNTHROID, LEVOTHROID) 112 MCG tablet   Oral   Take 112 mcg by mouth daily before breakfast.         . omeprazole (PRILOSEC) 20 MG capsule   Oral   Take 1 capsule (20 mg total) by mouth 2 (two) times daily before a meal.         . prochlorperazine (COMPAZINE) 5 MG tablet   Oral   Take 1 tablet (5 mg total) by mouth every 6 (six) hours as needed for nausea.   60 tablet   1   . simvastatin (ZOCOR) 40 MG tablet   Oral   Take 20 mg by mouth every evening.          . sucralfate (CARAFATE) 1 G tablet   Oral   Take 1 tablet (1 g total) by mouth 4 (four) times daily.   120 tablet   2   . warfarin (COUMADIN) 5 MG tablet    Oral   Take 1 tablet (5 mg total) by mouth daily. Or as directed by MD.   30 tablet   3    BP 98/46  Pulse 95  Temp(Src) 98.2 F (36.8 C) (Oral)  Resp 16  Ht 6' (1.829 m)  Wt 183 lb (83.008 kg)  BMI 24.81 kg/m2  SpO2 95% Physical Exam  Nursing note and vitals reviewed. Constitutional: He is oriented to person, place, and time. He appears well-developed and well-nourished. No distress.  HENT:  Head: Normocephalic and atraumatic.  Mouth/Throat: No oropharyngeal exudate.  Eyes: EOM are normal. Pupils are equal, round, and reactive to light.  Neck: Normal range of motion. Neck supple.  Cardiovascular: Normal rate and regular rhythm.  Exam reveals no friction rub.   No murmur heard. Pulmonary/Chest: Effort normal and breath sounds normal. No respiratory distress. He has no wheezes. He has no rales.  Abdominal: He exhibits no distension. There is no tenderness. There is no rebound.  Musculoskeletal: Normal range of motion. He exhibits no edema.  Neurological: He is alert and oriented to person, place, and time.  Skin: Rash (red/purple blisters with red base, no surrounding cellulitis, R buttock, R lateral hip, R groin. No penile involvement) noted. He is not diaphoretic.    ED Course  Procedures (including critical care time) Labs Review Labs Reviewed  CULTURE, BLOOD (ROUTINE X 2)  CULTURE, BLOOD (ROUTINE X 2)  URINE CULTURE  CBC WITH DIFFERENTIAL  COMPREHENSIVE METABOLIC PANEL  URINALYSIS, ROUTINE W REFLEX MICROSCOPIC   Imaging Review No results found.  EKG Interpretation   None       MDM   Final diagnoses:  Shingles  Hyponatremia  Vomiting    61M presents with fever, malaise, nausea, vomiting. His wife diagnosed with shingles last night and had a cycle of your colon by the PCP. He has history of esophageal cancer with metastases to the liver. He is currently on chemotherapy, last session last week.  He attempts of 102 at home. He's had a few episodes of  vomiting this morning. He is afebrile here, but had antipyretics within the past 6 hours. Hears pulses mildly tachycardic between 95 and 105. His blood pressure is soft with systolic in the 02D and diastolics in  the 40s and 50s. His lungs are clear, he is mentating well and relaxing constantly. Patient does have shingles rash on his right groin and lateral hip and buttock area. There is no other breath other rash. Sepsis protocol initiated with blood and urine cultures. I do not feel he needs a lumbar puncture at this time as he is mentating well and having no headaches. His set of antibiotics initially, also with acyclovir as you can see shingles this is worse. If we find a UTI or pneumonia, we will initiate antibiotics. Labs show hyponatremia. Admitted by Dr. Sherral Hammers.   Osvaldo Shipper, MD 04/07/13 [phone]

## 2013-04-07 NOTE — ED Notes (Signed)
Report given to [facility] on 4th floor

## 2013-04-07 NOTE — ED Notes (Signed)
Pt provided urinal, unable to urinate at this time

## 2013-04-08 ENCOUNTER — Other Ambulatory Visit: Payer: Medicare Other

## 2013-04-08 ENCOUNTER — Ambulatory Visit: Payer: Medicare Other

## 2013-04-08 DIAGNOSIS — D61818 Other pancytopenia: Secondary | ICD-10-CM

## 2013-04-08 DIAGNOSIS — R112 Nausea with vomiting, unspecified: Secondary | ICD-10-CM

## 2013-04-08 DIAGNOSIS — E860 Dehydration: Secondary | ICD-10-CM

## 2013-04-08 DIAGNOSIS — I4891 Unspecified atrial fibrillation: Secondary | ICD-10-CM

## 2013-04-08 DIAGNOSIS — E119 Type 2 diabetes mellitus without complications: Secondary | ICD-10-CM

## 2013-04-08 DIAGNOSIS — C787 Secondary malignant neoplasm of liver and intrahepatic bile duct: Secondary | ICD-10-CM

## 2013-04-08 DIAGNOSIS — C155 Malignant neoplasm of lower third of esophagus: Secondary | ICD-10-CM

## 2013-04-08 DIAGNOSIS — B029 Zoster without complications: Secondary | ICD-10-CM

## 2013-04-08 DIAGNOSIS — R509 Fever, unspecified: Secondary | ICD-10-CM

## 2013-04-08 LAB — URINE CULTURE
COLONY COUNT: NO GROWTH
CULTURE: NO GROWTH
Colony Count: >=100000

## 2013-04-08 LAB — COMPREHENSIVE METABOLIC PANEL
ALK PHOS: 63 U/L (ref 39–117)
ALT: 8 U/L (ref 0–53)
AST: 21 U/L (ref 0–37)
Albumin: 2.5 g/dL — ABNORMAL LOW (ref 3.5–5.2)
BUN: 23 mg/dL (ref 6–23)
CO2: 23 meq/L (ref 19–32)
Calcium: 8.8 mg/dL (ref 8.4–10.5)
Chloride: 101 mEq/L (ref 96–112)
Creatinine, Ser: 1.25 mg/dL (ref 0.50–1.35)
GFR, EST AFRICAN AMERICAN: 61 mL/min — AB (ref >90)
GFR, EST NON AFRICAN AMERICAN: 52 mL/min — AB (ref >90)
GLUCOSE: 111 mg/dL — AB (ref 70–99)
POTASSIUM: 4.5 meq/L (ref 3.7–5.3)
SODIUM: 133 meq/L — AB (ref 137–147)
TOTAL PROTEIN: 5.5 g/dL — AB (ref 6.0–8.3)
Total Bilirubin: 0.3 mg/dL (ref 0.3–1.2)

## 2013-04-08 LAB — LIPID PANEL
CHOL/HDL RATIO: 2.9 ratio
Cholesterol: 107 mg/dL (ref 0–200)
HDL: 37 mg/dL — ABNORMAL LOW (ref >39)
LDL Cholesterol: 51 mg/dL (ref 0–99)
TRIGLYCERIDES: 97 mg/dL (ref <150)
VLDL: 19 mg/dL (ref 0–40)

## 2013-04-08 LAB — CBC WITH DIFFERENTIAL/PLATELET
Basophils Absolute: 0 10*3/uL (ref 0.0–0.1)
Basophils Relative: 0 % (ref 0–1)
EOS PCT: 4 % (ref 0–5)
Eosinophils Absolute: 0.1 10*3/uL (ref 0.0–0.7)
HEMATOCRIT: 32.6 % — AB (ref 39.0–52.0)
HEMOGLOBIN: 11.2 g/dL — AB (ref 13.0–17.0)
LYMPHS PCT: 18 % (ref 12–46)
Lymphs Abs: 0.4 10*3/uL — ABNORMAL LOW (ref 0.7–4.0)
MCH: 30.7 pg (ref 26.0–34.0)
MCHC: 34.4 g/dL (ref 30.0–36.0)
MCV: 89.3 fL (ref 78.0–100.0)
MONO ABS: 0.2 10*3/uL (ref 0.1–1.0)
Monocytes Relative: 8 % (ref 3–12)
Neutro Abs: 1.7 10*3/uL (ref 1.7–7.7)
Neutrophils Relative %: 69 % (ref 43–77)
Platelets: 85 10*3/uL — ABNORMAL LOW (ref 150–400)
RBC: 3.65 MIL/uL — ABNORMAL LOW (ref 4.22–5.81)
RDW: 14.6 % (ref 11.5–15.5)
WBC: 2.4 10*3/uL — ABNORMAL LOW (ref 4.0–10.5)

## 2013-04-08 LAB — GLUCOSE, CAPILLARY
GLUCOSE-CAPILLARY: 57 mg/dL — AB (ref 70–99)
GLUCOSE-CAPILLARY: 81 mg/dL (ref 70–99)
GLUCOSE-CAPILLARY: 117 mg/dL — AB (ref 70–99)
Glucose-Capillary: 131 mg/dL — ABNORMAL HIGH (ref 70–99)
Glucose-Capillary: 149 mg/dL — ABNORMAL HIGH (ref 70–99)
Glucose-Capillary: 167 mg/dL — ABNORMAL HIGH (ref 70–99)
Glucose-Capillary: 252 mg/dL — ABNORMAL HIGH (ref 70–99)

## 2013-04-08 LAB — MAGNESIUM: MAGNESIUM: 1.6 mg/dL (ref 1.5–2.5)

## 2013-04-08 LAB — HEMOGLOBIN A1C
Hgb A1c MFr Bld: 8.7 % — ABNORMAL HIGH (ref <5.7)
Mean Plasma Glucose: 203 mg/dL — ABNORMAL HIGH (ref <117)

## 2013-04-08 LAB — PROTIME-INR
INR: 3.08 — ABNORMAL HIGH (ref 0.00–1.49)
PROTHROMBIN TIME: 30.7 s — AB (ref 11.6–15.2)

## 2013-04-08 MED ORDER — GABAPENTIN 300 MG PO CAPS
300.0000 mg | ORAL_CAPSULE | Freq: Two times a day (BID) | ORAL | Status: DC
  Administered 2013-04-08 – 2013-04-11 (×7): 300 mg via ORAL
  Filled 2013-04-08 (×8): qty 1

## 2013-04-08 MED ORDER — PROCHLORPERAZINE MALEATE 5 MG PO TABS
5.0000 mg | ORAL_TABLET | Freq: Four times a day (QID) | ORAL | Status: DC
  Filled 2013-04-08 (×2): qty 1

## 2013-04-08 MED ORDER — MEGESTROL ACETATE 400 MG/10ML PO SUSP
800.0000 mg | Freq: Every day | ORAL | Status: DC
  Administered 2013-04-08 – 2013-04-11 (×4): 800 mg via ORAL
  Filled 2013-04-08 (×4): qty 20

## 2013-04-08 MED ORDER — INSULIN ASPART 100 UNIT/ML ~~LOC~~ SOLN
0.0000 [IU] | SUBCUTANEOUS | Status: DC
  Administered 2013-04-08: 2 [IU] via SUBCUTANEOUS
  Administered 2013-04-08 – 2013-04-09 (×2): 5 [IU] via SUBCUTANEOUS
  Administered 2013-04-09: 3 [IU] via SUBCUTANEOUS
  Administered 2013-04-09: 5 [IU] via SUBCUTANEOUS
  Administered 2013-04-09: 3 [IU] via SUBCUTANEOUS
  Administered 2013-04-09: 2 [IU] via SUBCUTANEOUS
  Administered 2013-04-09: 1 [IU] via SUBCUTANEOUS
  Administered 2013-04-10 (×4): 2 [IU] via SUBCUTANEOUS
  Administered 2013-04-10: 18:00:00 via SUBCUTANEOUS
  Administered 2013-04-11: 3 [IU] via SUBCUTANEOUS

## 2013-04-08 MED ORDER — MEGESTROL ACETATE 400 MG/10ML PO SUSP: 800.0000 mg | Freq: Every day | ORAL | Status: DC

## 2013-04-08 MED ORDER — PROCHLORPERAZINE MALEATE 5 MG PO TABS
5.0000 mg | ORAL_TABLET | Freq: Four times a day (QID) | ORAL | Status: DC | PRN
Start: 2013-04-08 — End: 2013-04-11
  Administered 2013-04-08 – 2013-04-10 (×2): 5 mg via ORAL
  Filled 2013-04-08 (×2): qty 1

## 2013-04-08 NOTE — Progress Notes (Signed)
IP PROGRESS NOTE  Subjective:   Luis Mclean is well-known to me with a history of metastatic esophagus cancer. He was admitted yesterday with a fever, malaise, and a zoster rash.  He is alert today. No specific complaint.  Objective: Vital signs in last 24 hours: Blood pressure 115/65, pulse 104, temperature 98.5 F (36.9 C), temperature source Oral, resp. rate 20, height 6' (1.829 m), weight 181 lb 10.5 oz (82.4 kg), SpO2 100.00%.  Intake/Output from previous day: 02/12 0701 - 02/13 0700 In: 3346.7 [P.O.:480; I.V.:2866.7] Out: 1225 [Urine:1225]  Physical Exam:  HEENT: No thrush Lungs: Clear bilaterally Cardiac: Irregular Abdomen: No hepatomegaly, nontender Extremities: No leg edema Skin: Zoster rash over the right lower back and abdomen   Lab Results:  Recent Labs  04/07/13 0708 04/08/13 0456  WBC 4.6 2.4*  HGB 11.4* 11.2*  HCT 32.7* 32.6*  PLT 93* 85*    BMET  Recent Labs  04/07/13 0708 04/08/13 0456  NA 128* 133*  K 4.8 4.5  CL 95* 101  CO2 22 23  GLUCOSE 181* 111*  BUN 27* 23  CREATININE 1.46* 1.25  CALCIUM 8.6 8.8    Studies/Results: Dg Chest Port 1 View  04/07/2013   CLINICAL DATA:  Shortness of breath. Fever. Hypertension. Esophageal cancer  EXAM: PORTABLE CHEST - 1 VIEW  COMPARISON:  DG CHEST 2 VIEW dated 01/09/2013; NM PET IMAGE INITIAL (PI) SKULL BASE TO THIGH dated 12/02/2012  FINDINGS: Mild atherosclerotic calcification of the aortic arch. Heart size within normal limits for projection. The lungs appear clear. Thoracic spondylosis is present.  IMPRESSION: 1. Mild atherosclerotic calcification of the aortic arch. No acute findings.   Electronically Signed   By: Sherryl Barters M.D.   On: 04/07/2013 07:32    Medications: I have reviewed the patient's current medications.  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  Initiation of radiation and weekly Taxol/carboplatin 12/06/2012.  Radiation completed 01/05/2013.  CT 01/25/2013 with resolution of upper abdominal adenopathy and a decreased size of the liver metastasis.  Weekly Taxol/carboplatin initiated 03/04/2013. Last treated on 04/01/2013. 2. Indeterminate right hepatic mass on [id number]/68/1275 , hypermetabolic on the PET scan 17/00/1749. 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  3. Dysphagia/subxiphoid pain secondary to #1. Improved.  4. History of atrial fibrillation-maintained on Coumadin.  5. Renal insufficiency.  6. History of a CVA.  7. 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  8. Diabetes.   9. acute zoster rash at a right lower back dermatome 10. Fever-potentially related to the zoster infection, no other apparent source for infection, cultures pending 11. Mild pancytopenia secondary to chemotherapy 12. Nausea/vomiting-I doubt this is related to chemotherapy  Luis Mclean has metastatic esophagus cancer involving the liver. He presents with a zoster rash, fever, and nausea/vomiting. I doubt his symptoms are related to the esophagus cancer or chemotherapy. There is no apparent source for infection other than the zoster rash. Cultures are negative to date.  Recommendations: 1. Continue intravenous hydration and supportive care per Dr. Sherral Hammers 2. I will change the Compazine to as needed dosing. 3. Please call oncology as needed over the weekend. I will check on him 04/11/2013 if he remains in the hospital. He is scheduled for an outpatient visit on 04/15/2013      LOS: 1 day  [place], [place]  04/08/2013, 3:26 PM

## 2013-04-08 NOTE — Progress Notes (Signed)
[facility] PROGRESS NOTE  [name] FYB:[id number] DOB: 1929/02/28 DOA: 04/07/2013 PCP: Luis Fudge, MD  Assessment/Plan:  Shingles  -Continue valacyclovir  -Consider hydrocortisone topical cream PRN  -Continue Neurontin   Malignant neoplasm lower third esophagus  -Consult oncology (Dr. Lisbeth Mclean) in the a.m. to let him know patient has been admitted  -Stable per patient last XRT November 2014, last chemotherapy 6 February  -blood cultures NTD -urine culture  NTD -Low threshold for starting empiric antibiotic (fever/leukopenia)   Metastatic adenocarcinoma to liver -See malignant neoplasm esophagus   Atrial fibrillation with RVR  -Coumadin per pharmacy   Diabetes type 2  -Sensitive SSI   HLD  -Continue Zocor 20 mg  -Obtain lipid panel   Hypothyroidism  -Continue Synthroid 183mcg   Hyponatremia  -Most likely secondary dehydration continue normal saline    Code Status: Full  Family Communication: Wife present at bedside for discussion of plan of care  Disposition Plan: Resolution shingles   Consultants: Dr. Betsy Mclean (oncology)   Procedures: [facility] 04/07/2013 Mild atherosclerotic calcification of the aortic arch. Heart size  within normal limits for projection. The lungs appear clear.  Thoracic spondylosis is present.      Antibiotics: Valacyclovir 2/12>>    HPI/Subjective: Luis Mclean is a 80 y.o. 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 presents with fever, malaise, nausea, vomiting. His wife diagnosed with shingles last night and had a cycle of your colon by the PCP. He has history of esophageal cancer with metastases to the liver. He is currently on chemotherapy, last session last week.  He attempts of 102 at home. He's had a few episodes of vomiting this morning. He is afebrile here, but had antipyretics within the past 6 hours. Hears pulses mildly  tachycardic between 95 and 105. His blood pressure is soft with systolic in the 52D and diastolics in the 78E and 42P. His lungs are clear, he is mentating well and relaxing constantly. Patient does have shingles rash on his right groin and lateral hip and buttock area. There is no other breath other rash.  Sepsis protocol initiated with blood and urine cultures. I do not feel he needs a lumbar puncture at this time as he is mentating well and having no headaches. His set of antibiotics initially, also with acyclovir as you can see shingles this is worse. If we find a UTI or pneumonia, we will initiate antibiotics. Patient states fell multiple times yesterday, wife caught him on one of the occasions and help them down. Negative LOC. Negative head trauma    Objective: Filed Vitals:   04/07/13 1230 04/07/13 1342 04/07/13 2045 04/08/13 0402  BP: 136/76 153/93 152/88 134/79  Pulse: 94 101 103 78  Temp:  97.8 F (36.6 C) 97.8 F (36.6 C) 98.1 F (36.7 C)  TempSrc:  Oral Oral Oral  Resp: 21 18 19 16   Height:  6' (1.829 m)    Weight:  82.4 kg (181 lb 10.5 oz)    SpO2: 97% 100% 97% 98%    Intake/Output Summary (Last 24 hours) at 04/08/13 1216 Last data filed at 04/08/13 1132  Gross per 24 hour  Intake 3586.67 ml  Output   1850 ml  Net 1736.67 ml   Filed Weights   04/07/13 0642 04/07/13 1342  Weight: 83.008 kg (183 lb) 82.4 kg (181 lb 10.5 oz)    Exam: General: A./O. x4, NAD  Cardiovascular: Regular rhythm  and rate, negative murmurs rubs or gallops, DP/PT pulse plus one  Respiratory: To auscultation  Abdomen: Soft, nontender, nondistended, plus bowel  Skin: Rash consistent with zoster on right L1 dermatome; no other rash observed  Musculoskeletal: Good pedal edema     Data Reviewed: Basic Metabolic Panel:  Recent Labs Lab 04/07/13 0708 04/08/13 0456  NA 128* 133*  K 4.8 4.5  CL 95* 101  CO2 22 23  GLUCOSE 181* 111*  BUN 27* 23  CREATININE 1.46* 1.25  CALCIUM 8.6 8.8   MG  --  1.6   Liver Function Tests:  Recent Labs Lab 04/07/13 0708 04/08/13 0456  AST 21 21  ALT 9 8  ALKPHOS 66 63  BILITOT 0.7 0.3  PROT 5.5* 5.5*  ALBUMIN 2.6* 2.5*   No results found for this basename: LIPASE, AMYLASE,  in the last 168 hours No results found for this basename: AMMONIA,  in the last 168 hours CBC:  Recent Labs Lab 04/07/13 0708 04/08/13 0456  WBC 4.6 2.4*  NEUTROABS 4.0 1.7  HGB 11.4* 11.2*  HCT 32.7* 32.6*  MCV 89.1 89.3  PLT 93* 85*   Cardiac Enzymes: No results found for this basename: CKTOTAL, CKMB, CKMBINDEX, TROPONINI,  in the last 168 hours BNP (last 3 results) No results found for this basename: PROBNP,  in the last 8760 hours CBG:  Recent Labs Lab 04/08/13 0358 04/08/13 0433 04/08/13 0539 04/08/13 0743 04/08/13 1154  GLUCAP 57* 81 117* 131* 149*    Recent Results (from the past 240 hour(s))  CULTURE, BLOOD (ROUTINE X 2)     Status: None   Collection Time    04/07/13  7:08 AM      Result Value Ref Range Status   Specimen Description BLOOD LEFT ARM   Final   Special Requests BOTTLES DRAWN AEROBIC AND ANAEROBIC 2ML   Final   Culture  Setup Time     Final   Value: 04/07/2013 10:18     Performed at [facility]   Culture     Final   Value:        BLOOD CULTURE RECEIVED NO GROWTH TO DATE CULTURE WILL BE HELD FOR 5 DAYS BEFORE ISSUING A FINAL NEGATIVE REPORT     Performed at [facility]   Report Status PENDING   Incomplete  CULTURE, BLOOD (ROUTINE X 2)     Status: None   Collection Time    04/07/13  8:15 AM      Result Value Ref Range Status   Specimen Description BLOOD RIGHT ANTECUBITAL   Final   Special Requests BOTTLES DRAWN AEROBIC AND ANAEROBIC 5ML   Final   Culture  Setup Time     Final   Value: 04/07/2013 10:17     Performed at [facility]   Culture     Final   Value:        BLOOD CULTURE RECEIVED NO GROWTH TO DATE CULTURE WILL BE HELD FOR 5 DAYS BEFORE ISSUING A FINAL NEGATIVE REPORT      Performed at [facility]   Report Status PENDING   Incomplete  URINE CULTURE     Status: None   Collection Time    04/07/13  9:31 AM      Result Value Ref Range Status   Specimen Description URINE, CATHETERIZED   Final   Special Requests NONE   Final   Culture  Setup Time     Final   Value: 04/07/2013  12:58     Performed at [place]     Final   Value: NO GROWTH     Performed at [facility]   Culture     Final   Value: NO GROWTH     Performed at [facility]   Report Status 04/08/2013 FINAL   Final     Studies: Dg Chest Port 1 View  04/07/2013   CLINICAL DATA:  Shortness of breath. Fever. Hypertension. Esophageal cancer  EXAM: PORTABLE CHEST - 1 VIEW  COMPARISON:  DG CHEST 2 VIEW dated 01/09/2013; NM PET IMAGE INITIAL (PI) SKULL BASE TO THIGH dated 12/02/2012  FINDINGS: Mild atherosclerotic calcification of the aortic arch. Heart size within normal limits for projection. The lungs appear clear. Thoracic spondylosis is present.  IMPRESSION: 1. Mild atherosclerotic calcification of the aortic arch. No acute findings.   Electronically Signed   By: Sherryl Barters M.D.   On: 04/07/2013 07:32    Scheduled Meds: . aspirin EC  81 mg Oral Daily  . gabapentin  300 mg Oral QHS  . insulin aspart  0-15 Units Subcutaneous 6 times per day  . insulin glargine  12 Units Subcutaneous QHS  . levothyroxine  112 mcg Oral QAC breakfast  . pantoprazole  40 mg Oral Daily  . simvastatin  20 mg Oral QPM  . sucralfate  1 g Oral QID  . valACYclovir  1,000 mg Oral TID  . Warfarin - Pharmacist Dosing Inpatient   Does not apply q1800   Continuous Infusions: . sodium chloride 1,000 mL (04/08/13 0637)    Principal Problem:   Shingles outbreak Active Problems:   Malignant neoplasm of lower third of esophagus   Atrial fibrillation with RVR   Hyponatremia   Dehydration   Chronic kidney disease, stage 3   DM type 2 (diabetes mellitus, type 2)    Metastatic adenocarcinoma to liver   Zoster    Time spent: 40 minute    WOODS, [place], J  [facility] Pager [phone]. If 7PM-7AM, please contact night-coverage at [email or web address], password [facility] 04/08/2013, 12:16 PM  LOS: 1 day

## 2013-04-08 NOTE — Progress Notes (Signed)
ANTICOAGULATION CONSULT NOTE - Follow up  Pharmacy Consult for warfarin Indication: atrial fibrillation  No Known Allergies  Patient Measurements: Height: 6' (182.9 cm) Weight: 181 lb 10.5 oz (82.4 kg) IBW/kg (Calculated) : 77.6   Vital Signs: Temp: 98.5 F (36.9 C) (02/13 1428) Temp src: Oral (02/13 1428) BP: 115/65 mmHg (02/13 1434) Pulse Rate: 104 (02/13 1434)  Labs:  Recent Labs  04/07/13 0708 04/07/13 1130 04/08/13 0456 04/08/13 1345  HGB 11.4*  --  11.2*  --   HCT 32.7*  --  32.6*  --   PLT 93*  --  85*  --   LABPROT  --  26.9*  --  30.7*  INR  --  2.59*  --  3.08*  CREATININE 1.46*  --  1.25  --     Estimated Creatinine Clearance: 50.9 ml/min (by C-G formula based on Cr of 1.25).   Assessment: 80 y/o M on chronic warfarin therapy for atrial fibrillation and hx of CVA admitted 2/12 with fever, weakness, and worsening herpes zoster lesions. Patient also on chemotherapy for esophageal cancer with liver metastases. Pharmacy has been consulted to dose warfarin while inpatient. Concomitant low-dose ASA 81 mg daily is noted.  Most recent warfarin dosage prior to admission was 5 mg daily, last taken on 2/11.     INR on admission is therapeutic (2.59)  INR today slightly SUPRAtherapeutic at 3.08  H/H and platelets have both decreased today, Hgb appears close to baseline, plts decreased from baseline  No bleeding has been noted  Goal of Therapy:  INR 2-3 Monitor platelets   Plan:  - hold Coumadin tonight with decrease in platelets - f/u AM CBC and INR for further warfarin dosing - pharmacy will f/u daily  Thank you for the consult.  Johny Drilling, PharmD, BCPS Pager: [phone] Pharmacy: [phone] 04/08/2013 4:54 PM

## 2013-04-08 NOTE — Care Management Note (Signed)
    Page 1 of 1   04/08/2013     4:18:09 PM   CARE MANAGEMENT NOTE 04/08/2013  Patient:  Luis Mclean, Luis Mclean   Account Number:  [id number]  Date Initiated:  04/08/2013  Documentation initiated by:  Dessa Phi  Subjective/Objective Assessment:   80 Y/O M ADMITTED W/SHINGLES.HX:MET ADENO CA LIVER.     Action/Plan:   FROM HOME W/SPOUSE.HAS PCP,PHARMACY.   Anticipated DC Date:  04/11/2013   Anticipated DC Plan:  [place]  CM consult      Choice offered to / List presented to:             Status of service:  In process, will continue to follow Medicare Important Message given?   (If response is "NO", the following Medicare IM given date fields will be blank) Date Medicare IM given:   Date Additional Medicare IM given:    Discharge Disposition:    Per UR Regulation:  Reviewed for med. necessity/level of care/duration of stay  If discussed at [place] of Stay Meetings, dates discussed:    Comments:  04/08/13 Adelae Yodice RN,BSN NCM [phone] WOULD RECOMMEND PT/OT CONS WHEN MEDICALLY APPROPRIATE.

## 2013-04-08 NOTE — Progress Notes (Signed)
Hypoglycemic Event  CBG: 57   Treatment: 15 GM carbohydrate snack  Symptoms: Pt stated he felt "a little fuzzy"  Follow-up CBG: Time:0430 CBG Result: 81  Possible Reasons for Event: Unknown  Comments/MD notified: Lamar Blinks, NP notified    Threasa Alpha  Remember to initiate Hypoglycemia Order Set & complete

## 2013-04-09 DIAGNOSIS — E871 Hypo-osmolality and hyponatremia: Secondary | ICD-10-CM

## 2013-04-09 DIAGNOSIS — T451X5A Adverse effect of antineoplastic and immunosuppressive drugs, initial encounter: Secondary | ICD-10-CM

## 2013-04-09 DIAGNOSIS — D61810 Antineoplastic chemotherapy induced pancytopenia: Secondary | ICD-10-CM

## 2013-04-09 DIAGNOSIS — K5900 Constipation, unspecified: Secondary | ICD-10-CM

## 2013-04-09 DIAGNOSIS — N183 Chronic kidney disease, stage 3 unspecified: Secondary | ICD-10-CM

## 2013-04-09 LAB — CBC WITH DIFFERENTIAL/PLATELET
BASOS ABS: 0 10*3/uL (ref 0.0–0.1)
Basophils Relative: 1 % (ref 0–1)
EOS ABS: 0.1 10*3/uL (ref 0.0–0.7)
EOS PCT: 6 % — AB (ref 0–5)
HEMATOCRIT: 32 % — AB (ref 39.0–52.0)
HEMOGLOBIN: 11 g/dL — AB (ref 13.0–17.0)
LYMPHS ABS: 0.4 10*3/uL — AB (ref 0.7–4.0)
Lymphocytes Relative: 19 % (ref 12–46)
MCH: 30.5 pg (ref 26.0–34.0)
MCHC: 34.4 g/dL (ref 30.0–36.0)
MCV: 88.6 fL (ref 78.0–100.0)
Monocytes Absolute: 0.3 10*3/uL (ref 0.1–1.0)
Monocytes Relative: 12 % (ref 3–12)
Neutro Abs: 1.4 10*3/uL — ABNORMAL LOW (ref 1.7–7.7)
Neutrophils Relative %: 62 % (ref 43–77)
Platelets: 107 10*3/uL — ABNORMAL LOW (ref 150–400)
RBC: 3.61 MIL/uL — ABNORMAL LOW (ref 4.22–5.81)
RDW: 14.4 % (ref 11.5–15.5)
WBC: 2.2 10*3/uL — ABNORMAL LOW (ref 4.0–10.5)

## 2013-04-09 LAB — GLUCOSE, CAPILLARY
GLUCOSE-CAPILLARY: 128 mg/dL — AB (ref 70–99)
GLUCOSE-CAPILLARY: 245 mg/dL — AB (ref 70–99)
GLUCOSE-CAPILLARY: 292 mg/dL — AB (ref 70–99)
Glucose-Capillary: 110 mg/dL — ABNORMAL HIGH (ref 70–99)
Glucose-Capillary: 174 mg/dL — ABNORMAL HIGH (ref 70–99)
Glucose-Capillary: 239 mg/dL — ABNORMAL HIGH (ref 70–99)
Glucose-Capillary: 264 mg/dL — ABNORMAL HIGH (ref 70–99)

## 2013-04-09 LAB — COMPREHENSIVE METABOLIC PANEL
ALT: 8 U/L (ref 0–53)
AST: 20 U/L (ref 0–37)
Albumin: 2.5 g/dL — ABNORMAL LOW (ref 3.5–5.2)
Alkaline Phosphatase: 63 U/L (ref 39–117)
BUN: 16 mg/dL (ref 6–23)
CO2: 23 meq/L (ref 19–32)
CREATININE: 1.1 mg/dL (ref 0.50–1.35)
Calcium: 8.8 mg/dL (ref 8.4–10.5)
Chloride: 102 mEq/L (ref 96–112)
GFR, EST AFRICAN AMERICAN: 71 mL/min — AB (ref >90)
GFR, EST NON AFRICAN AMERICAN: 61 mL/min — AB (ref >90)
GLUCOSE: 120 mg/dL — AB (ref 70–99)
Potassium: 3.8 mEq/L (ref 3.7–5.3)
Sodium: 135 mEq/L — ABNORMAL LOW (ref 137–147)
Total Bilirubin: 0.3 mg/dL (ref 0.3–1.2)
Total Protein: 5.8 g/dL — ABNORMAL LOW (ref 6.0–8.3)

## 2013-04-09 LAB — PROTIME-INR
INR: 2.61 — ABNORMAL HIGH (ref 0.00–1.49)
PROTHROMBIN TIME: 27 s — AB (ref 11.6–15.2)

## 2013-04-09 LAB — MAGNESIUM: MAGNESIUM: 1.5 mg/dL (ref 1.5–2.5)

## 2013-04-09 MED ORDER — METOPROLOL TARTRATE 1 MG/ML IV SOLN: 5.0000 mg | Freq: Four times a day (QID) | INTRAVENOUS | Status: DC | PRN

## 2013-04-09 MED ORDER — METOPROLOL TARTRATE 1 MG/ML IV SOLN
5.0000 mg | Freq: Once | INTRAVENOUS | Status: AC
Start: 2013-04-09 — End: 2013-04-09
  Administered 2013-04-09: 5 mg via INTRAVENOUS
  Filled 2013-04-09: qty 5

## 2013-04-09 MED ORDER — WARFARIN SODIUM 4 MG PO TABS
4.0000 mg | ORAL_TABLET | Freq: Once | ORAL | Status: AC
  Administered 2013-04-09: 4 mg via ORAL
  Filled 2013-04-09: qty 1

## 2013-04-09 MED ORDER — POLYETHYLENE GLYCOL 3350 17 G PO PACK
17.0000 g | PACK | Freq: Every day | ORAL | Status: DC
  Administered 2013-04-09 – 2013-04-10 (×2): 17 g via ORAL
  Filled 2013-04-09 (×2): qty 1

## 2013-04-09 MED ORDER — SODIUM CHLORIDE 0.9 % IV SOLN
1000.0000 mL | INTRAVENOUS | Status: DC
  Administered 2013-04-09 – 2013-04-10 (×3): 1000 mL via INTRAVENOUS

## 2013-04-09 NOTE — Progress Notes (Signed)
[facility] PROGRESS NOTE  [name] [id number] DOB: 05-Aug-1929 DOA: 04/07/2013 PCP: Doree Fudge, MD  Assessment/Plan:  Shingles  -Continue valacyclovir  -Consider hydrocortisone topical cream PRN  -Continue Neurontin   Malignant neoplasm lower third esophagus  -Consulted oncology (Dr. Lisbeth Renshaw) and informed him patient had been hospitalized   -Stable per patient last XRT November 2014, last chemotherapy 6 February  -blood cultures NTD -urine culture  NTD -Low threshold for starting empiric antibiotic (fever/leukopenia)   Metastatic adenocarcinoma to liver -See malignant neoplasm esophagus   Atrial fibrillation with RVR  -Coumadin per pharmacy   Diabetes type 2  -Sensitive SSI   HLD  -Continue Zocor 20 mg  -Obtain lipid panel   Hypothyroidism  -Continue Synthroid 180mcg   Hyponatremia  -Most likely secondary dehydration continue normal saline to my: But reduced to ml/ 75hr  HTN/tachycardia asymptomatic -Metoprolol 5 mg IV x1 -Metoprolol 5 mg IV 4 times a day PRN HR> 100 or SBP > 160    Code Status: Full  Family Communication: Wife present at bedside for discussion of plan of care  Disposition Plan: Resolution shingles   Consultants: Dr. Betsy Coder (oncology)   Procedures: [facility] 04/07/2013 Mild atherosclerotic calcification of the aortic arch. Heart size  within normal limits for projection. The lungs appear clear.  Thoracic spondylosis is present.      Antibiotics: Valacyclovir 2/12>>    HPI/Subjective: Luis Mclean is a 80 y.o. 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 presents with fever, malaise, nausea, vomiting. His wife diagnosed with shingles last night and had a cycle of your colon by the PCP. He has history of esophageal cancer with metastases to the liver. He is currently on chemotherapy, last session last week.  He attempts of 102 at  home. He's had a few episodes of vomiting this morning. He is afebrile here, but had antipyretics within the past 6 hours. Hears pulses mildly tachycardic between 95 and 105. His blood pressure is soft with systolic in the 0000000 and diastolics in the 123456 and 123456. His lungs are clear, he is mentating well and relaxing constantly. Patient does have shingles rash on his right groin and lateral hip and buttock area. There is no other breath other rash.  Sepsis protocol initiated with blood and urine cultures. I do not feel he needs a lumbar puncture at this time as he is mentating well and having no headaches. His set of antibiotics initially, also with acyclovir as you can see shingles this is worse. If we find a UTI or pneumonia, we will initiate antibiotics. Patient states fell multiple times yesterday, wife caught him on one of the occasions and help them down. Negative LOC. Negative head trauma    Objective: Filed Vitals:   04/08/13 1434 04/08/13 2012 04/09/13 0405 04/09/13 1410  BP: 115/65 123/71 146/80 139/77  Pulse: 104 105 86 105  Temp:  97.9 F (36.6 C) 98.1 F (36.7 C) 98.1 F (36.7 C)  TempSrc:  Oral Oral Oral  Resp:  20 20 20   Height:      Weight:      SpO2:  100% 98% 99%    Intake/Output Summary (Last 24 hours) at 04/09/13 1845 Last data filed at 04/09/13 1830  Gross per 24 hour  Intake 4477.5 ml  Output    675 ml  Net 3802.5 ml   Filed Weights   04/07/13 0642 04/07/13 1342  Weight: 83.008  kg (183 lb) 82.4 kg (181 lb 10.5 oz)    Exam: General: A./O. x4, NAD  Cardiovascular: Regular rhythm and rate, negative murmurs rubs or gallops, DP/PT pulse plus one  Respiratory: Clear to auscultation bilaterally Abdomen: Soft, nontender, nondistended, plus bowel  Skin: Rash consistent with zoster on right L1 dermatome; no other rash observed  Musculoskeletal: Good pedal edema     Data Reviewed: Basic Metabolic Panel:  Recent Labs Lab 04/07/13 0708 04/08/13 0456  04/09/13 0803  NA 128* 133* 135*  K 4.8 4.5 3.8  CL 95* 101 102  CO2 22 23 23   GLUCOSE 181* 111* 120*  BUN 27* 23 16  CREATININE 1.46* 1.25 1.10  CALCIUM 8.6 8.8 8.8  MG  --  1.6 1.5   Liver Function Tests:  Recent Labs Lab 04/07/13 0708 04/08/13 0456 04/09/13 0803  AST 21 21 20   ALT 9 8 8   ALKPHOS 66 63 63  BILITOT 0.7 0.3 0.3  PROT 5.5* 5.5* 5.8*  ALBUMIN 2.6* 2.5* 2.5*   No results found for this basename: LIPASE, AMYLASE,  in the last 168 hours No results found for this basename: AMMONIA,  in the last 168 hours CBC:  Recent Labs Lab 04/07/13 0708 04/08/13 0456 04/09/13 0803  WBC 4.6 2.4* 2.2*  NEUTROABS 4.0 1.7 1.4*  HGB 11.4* 11.2* 11.0*  HCT 32.7* 32.6* 32.0*  MCV 89.1 89.3 88.6  PLT 93* 85* 107*   Cardiac Enzymes: No results found for this basename: CKTOTAL, CKMB, CKMBINDEX, TROPONINI,  in the last 168 hours BNP (last 3 results) No results found for this basename: PROBNP,  in the last 8760 hours CBG:  Recent Labs Lab 04/09/13 0018 04/09/13 0405 04/09/13 0734 04/09/13 1219 04/09/13 1749  GLUCAP 239* 128* 110* 174* 245*    Recent Results (from the past 240 hour(s))  CULTURE, BLOOD (ROUTINE X 2)     Status: None   Collection Time    04/07/13  7:08 AM      Result Value Ref Range Status   Specimen Description BLOOD LEFT ARM   Final   Special Requests BOTTLES DRAWN AEROBIC AND ANAEROBIC 2ML   Final   Culture  Setup Time     Final   Value: 04/07/2013 10:18     Performed at [facility]   Culture     Final   Value:        BLOOD CULTURE RECEIVED NO GROWTH TO DATE CULTURE WILL BE HELD FOR 5 DAYS BEFORE ISSUING A FINAL NEGATIVE REPORT     Performed at [facility]   Report Status PENDING   Incomplete  CULTURE, BLOOD (ROUTINE X 2)     Status: None   Collection Time    04/07/13  8:15 AM      Result Value Ref Range Status   Specimen Description BLOOD RIGHT ANTECUBITAL   Final   Special Requests BOTTLES DRAWN AEROBIC AND ANAEROBIC  5ML   Final   Culture  Setup Time     Final   Value: 04/07/2013 10:17     Performed at [facility]   Culture     Final   Value:        BLOOD CULTURE RECEIVED NO GROWTH TO DATE CULTURE WILL BE HELD FOR 5 DAYS BEFORE ISSUING A FINAL NEGATIVE REPORT     Performed at [facility]   Report Status PENDING   Incomplete  URINE CULTURE     Status: None   Collection Time  04/07/13  9:31 AM      Result Value Ref Range Status   Specimen Description URINE, CATHETERIZED   Final   Special Requests NONE   Final   Culture  Setup Time     Final   Value: 04/07/2013 12:58     Performed at [place]     Final   Value: NO GROWTH     Performed at [facility]   Culture     Final   Value: NO GROWTH     Performed at [facility]   Report Status 04/08/2013 FINAL   Final  URINE CULTURE     Status: None   Collection Time    04/07/13 10:10 PM      Result Value Ref Range Status   Specimen Description URINE, CLEAN CATCH   Final   Special Requests NONE   Final   Culture  Setup Time     Final   Value: 04/08/2013 02:13     Performed at [place]     Final   Value: >=100,000 COLONIES/ML     Performed at [facility]   Culture     Final   Value: Multiple bacterial morphotypes present, none predominant. Suggest appropriate recollection if clinically indicated.     Performed at [facility]   Report Status 04/08/2013 FINAL   Final     Studies: No results found.  Scheduled Meds: . aspirin EC  81 mg Oral Daily  . gabapentin  300 mg Oral BID  . insulin aspart  0-9 Units Subcutaneous 6 times per day  . insulin glargine  12 Units Subcutaneous QHS  . levothyroxine  112 mcg Oral QAC breakfast  . megestrol  800 mg Oral Daily  . pantoprazole  40 mg Oral Daily  . simvastatin  20 mg Oral QPM  . valACYclovir  1,000 mg Oral TID  . Warfarin - Pharmacist Dosing Inpatient   Does not apply q1800   Continuous  Infusions: . sodium chloride 1,000 mL (04/09/13 1758)    Principal Problem:   Shingles outbreak Active Problems:   Malignant neoplasm of lower third of esophagus   Atrial fibrillation with RVR   Hyponatremia   Dehydration   Chronic kidney disease, stage 3   DM type 2 (diabetes mellitus, type 2)   Metastatic adenocarcinoma to liver   Zoster    Time spent: 40 minute    Luis Mclean, [place], J  [facility] Pager [phone]. If 7PM-7AM, please contact night-coverage at [email or web address], password [facility] 04/09/2013, 6:45 PM  LOS: 2 days

## 2013-04-09 NOTE — Progress Notes (Signed)
ANTICOAGULATION CONSULT NOTE - Follow up  Pharmacy Consult for warfarin Indication: atrial fibrillation  No Known Allergies  Patient Measurements: Height: 6' (182.9 cm) Weight: 181 lb 10.5 oz (82.4 kg) IBW/kg (Calculated) : 77.6   Vital Signs: Temp: 98.1 F (36.7 C) (02/14 1410) Temp src: Oral (02/14 1410) BP: 139/77 mmHg (02/14 1410) Pulse Rate: 105 (02/14 1410)  Labs:  Recent Labs  04/07/13 0708 04/07/13 1130 04/08/13 0456 04/08/13 1345 04/09/13 0803  HGB 11.4*  --  11.2*  --  11.0*  HCT 32.7*  --  32.6*  --  32.0*  PLT 93*  --  85*  --  107*  LABPROT  --  26.9*  --  30.7* 27.0*  INR  --  2.59*  --  3.08* 2.61*  CREATININE 1.46*  --  1.25  --  1.10    Estimated Creatinine Clearance: 57.8 ml/min (by C-G formula based on Cr of 1.1).   Assessment: 80 y/o M on chronic warfarin therapy for atrial fibrillation and hx of CVA admitted 2/12 with fever, weakness, and worsening herpes zoster lesions. Patient also on chemotherapy for esophageal cancer with liver metastases. Pharmacy has been consulted to dose warfarin while inpatient. Concomitant low-dose ASA 81 mg daily is noted.  Most recent warfarin dosage prior to admission was 5 mg daily, last taken on 2/11.     INR on admission is therapeutic (2.59)  INR today = 2.61, therapeutic, after holding dose last night.    Hgb stable, plts improved to 107.    No bleeding has been noted  Goal of Therapy:  INR 2-3 Monitor platelets   Plan:  Coumadin 4mg  po x 1 tonight  Thank you for the consult.  Ralene Bathe, PharmD, BCPS 04/09/2013, 2:21 PM  Pager: [phone]

## 2013-04-10 ENCOUNTER — Other Ambulatory Visit: Payer: Self-pay | Admitting: Oncology

## 2013-04-10 DIAGNOSIS — D72819 Decreased white blood cell count, unspecified: Secondary | ICD-10-CM

## 2013-04-10 DIAGNOSIS — R5381 Other malaise: Secondary | ICD-10-CM

## 2013-04-10 DIAGNOSIS — R5383 Other fatigue: Secondary | ICD-10-CM

## 2013-04-10 LAB — GLUCOSE, CAPILLARY
GLUCOSE-CAPILLARY: 157 mg/dL — AB (ref 70–99)
GLUCOSE-CAPILLARY: 282 mg/dL — AB (ref 70–99)
Glucose-Capillary: 185 mg/dL — ABNORMAL HIGH (ref 70–99)
Glucose-Capillary: 187 mg/dL — ABNORMAL HIGH (ref 70–99)
Glucose-Capillary: 305 mg/dL — ABNORMAL HIGH (ref 70–99)

## 2013-04-10 LAB — CBC WITH DIFFERENTIAL/PLATELET
BASOS ABS: 0 10*3/uL (ref 0.0–0.1)
BASOS PCT: 1 % (ref 0–1)
EOS PCT: 3 % (ref 0–5)
Eosinophils Absolute: 0.1 10*3/uL (ref 0.0–0.7)
HEMATOCRIT: 28.7 % — AB (ref 39.0–52.0)
HEMOGLOBIN: 10.2 g/dL — AB (ref 13.0–17.0)
LYMPHS PCT: 26 % (ref 12–46)
Lymphs Abs: 0.7 10*3/uL (ref 0.7–4.0)
MCH: 31.4 pg (ref 26.0–34.0)
MCHC: 35.5 g/dL (ref 30.0–36.0)
MCV: 88.3 fL (ref 78.0–100.0)
Monocytes Absolute: 0.4 10*3/uL (ref 0.1–1.0)
Monocytes Relative: 14 % — ABNORMAL HIGH (ref 3–12)
NEUTROS PCT: 56 % (ref 43–77)
Neutro Abs: 1.4 10*3/uL — ABNORMAL LOW (ref 1.7–7.7)
Platelets: 123 10*3/uL — ABNORMAL LOW (ref 150–400)
RBC: 3.25 MIL/uL — AB (ref 4.22–5.81)
RDW: 14.5 % (ref 11.5–15.5)
WBC: 2.5 10*3/uL — ABNORMAL LOW (ref 4.0–10.5)

## 2013-04-10 LAB — COMPREHENSIVE METABOLIC PANEL
ALBUMIN: 2.2 g/dL — AB (ref 3.5–5.2)
ALT: 8 U/L (ref 0–53)
AST: 18 U/L (ref 0–37)
Alkaline Phosphatase: 61 U/L (ref 39–117)
BUN: 19 mg/dL (ref 6–23)
CO2: 19 mEq/L (ref 19–32)
CREATININE: 1.37 mg/dL — AB (ref 0.50–1.35)
Calcium: 8.6 mg/dL (ref 8.4–10.5)
Chloride: 105 mEq/L (ref 96–112)
GFR calc Af Amer: 54 mL/min — ABNORMAL LOW (ref >90)
GFR calc non Af Amer: 47 mL/min — ABNORMAL LOW (ref >90)
Glucose, Bld: 208 mg/dL — ABNORMAL HIGH (ref 70–99)
Potassium: 4.3 mEq/L (ref 3.7–5.3)
Sodium: 135 mEq/L — ABNORMAL LOW (ref 137–147)
TOTAL PROTEIN: 5 g/dL — AB (ref 6.0–8.3)
Total Bilirubin: 0.2 mg/dL — ABNORMAL LOW (ref 0.3–1.2)

## 2013-04-10 LAB — PROTIME-INR
INR: 2.24 — ABNORMAL HIGH (ref 0.00–1.49)
Prothrombin Time: 24.1 seconds — ABNORMAL HIGH (ref 11.6–15.2)

## 2013-04-10 LAB — MAGNESIUM: Magnesium: 1.5 mg/dL (ref 1.5–2.5)

## 2013-04-10 MED ORDER — METOPROLOL TARTRATE 12.5 MG HALF TABLET
12.5000 mg | ORAL_TABLET | Freq: Two times a day (BID) | ORAL | Status: DC
  Administered 2013-04-10 – 2013-04-11 (×3): 12.5 mg via ORAL
  Filled 2013-04-10 (×4): qty 1

## 2013-04-10 MED ORDER — WARFARIN SODIUM 4 MG PO TABS
4.0000 mg | ORAL_TABLET | Freq: Once | ORAL | Status: AC
  Administered 2013-04-10: 4 mg via ORAL
  Filled 2013-04-10: qty 1

## 2013-04-10 MED ORDER — WARFARIN SODIUM 5 MG PO TABS
5.0000 mg | ORAL_TABLET | Freq: Once | ORAL | Status: DC
  Filled 2013-04-10: qty 1

## 2013-04-10 MED ORDER — BISACODYL 10 MG RE SUPP
10.0000 mg | Freq: Once | RECTAL | Status: AC
  Administered 2013-04-10: 10 mg via RECTAL
  Filled 2013-04-10: qty 1

## 2013-04-10 MED ORDER — POLYETHYLENE GLYCOL 3350 17 G PO PACK
17.0000 g | PACK | Freq: Two times a day (BID) | ORAL | Status: DC
  Administered 2013-04-10 – 2013-04-11 (×2): 17 g via ORAL
  Filled 2013-04-10 (×3): qty 1

## 2013-04-10 NOTE — Progress Notes (Signed)
[facility] PROGRESS NOTE  Luis Mclean NGE:[id number] DOB: September 04, 1929 DOA: 04/07/2013 PCP: Doree Fudge, MD  Assessment/Plan:  Shingles  -Continue valacyclovir  -Consider hydrocortisone topical cream PRN  -Continue Neurontin   Malignant neoplasm lower third esophagus  -Consulted oncology (Dr. Lisbeth Renshaw) and informed him patient had been hospitalized   -Stable per patient last XRT November 2014, last chemotherapy 6 February  -blood cultures NTD -urine culture  NTD -Low threshold for starting empiric antibiotic (fever/leukopenia)   Metastatic adenocarcinoma to liver -See malignant neoplasm esophagus   Atrial fibrillation with RVR  -Coumadin per pharmacy -Start metoprolol 12.5 mg BID   Diabetes type 2  -Sensitive SSI   HLD  -Continue Zocor 20 mg  -Obtain lipid panel   Hypothyroidism  -Continue Synthroid 136mcg   Hyponatremia  -Most likely secondary dehydration continue normal saline to my: Continue normal saline at ml/ 75hr -Improved but not resolved.   HTN/atrial fibrillation  - Currently controlled on metoprolol 5 mg IV 4 times a day  PRN. Will start patient on by mouth metoprolol 12.5 mg  BID  Constipation -Increase MiraLax to BID -Add Dulcolax suppository x1  Leukopenia -Trending up  Generalized weakness -PT/OT to evaluate -Obtain ambulatory SpO2    Code Status: Full  Family Communication: Wife present at bedside for discussion of plan of care  Disposition Plan: Resolution shingles   Consultants: Dr. Betsy Coder (oncology)   Procedures: [facility] 04/07/2013 Mild atherosclerotic calcification of the aortic arch. Heart size  within normal limits for projection. The lungs appear clear.  Thoracic spondylosis is present.      Antibiotics: Valacyclovir 2/12>>    HPI/Subjective: Luis Mclean is a 80 y.o. 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  presents with fever, malaise, nausea, vomiting. His wife diagnosed with shingles last night and had a cycle of your colon by the PCP. He has history of esophageal cancer with metastases to the liver. He is currently on chemotherapy, last session last week.  He attempts of 102 at home. He's had a few episodes of vomiting this morning. He is afebrile here, but had antipyretics within the past 6 hours. Hears pulses mildly tachycardic between 95 and 105. His blood pressure is soft with systolic in the 40N and diastolics in the 02V and 25D. His lungs are clear, he is mentating well and relaxing constantly. Patient does have shingles rash on his right groin and lateral hip and buttock area. There is no other breath other rash.  Sepsis protocol initiated with blood and urine cultures. I do not feel he needs a lumbar puncture at this time as he is mentating well and having no headaches. His set of antibiotics initially, also with acyclovir as you can see shingles this is worse. If we find a UTI or pneumonia, we will initiate antibiotics. Patient states fell multiple times yesterday, wife caught him on one of the occasions and help them down. Negative LOC. Negative head trauma 2/15 patient eating each meal secondary to increased appetite from the Megace. States feels constipated and requests a suppository.    Objective: Filed Vitals:   04/09/13 2225 04/09/13 2230 04/09/13 2231 04/10/13 0530  BP: 140/100 138/85 129/61 136/66  Pulse:    98  Temp: 98.6 F (37 C)   98.1 F (36.7 C)  TempSrc: Oral   Oral  Resp:      Height:      Weight:  SpO2:    97%    Intake/Output Summary (Last 24 hours) at 04/10/13 1227 Last data filed at 04/10/13 1000  Gross per 24 hour  Intake 3311.25 ml  Output   1000 ml  Net 2311.25 ml   Filed Weights   04/07/13 [id number] 04/07/13 1342  Weight: 83.008 kg (183 lb) 82.4 kg (181 lb 10.5 oz)    Exam: General: A./O. x4, NAD  Cardiovascular: Irregular irregular rhythm and rate,  negative murmurs rubs or gallops, DP/PT pulse plus one  Respiratory: Clear to auscultation bilaterally Abdomen: Soft, nontender, nondistended, plus bowel  Skin: Rash consistent with zoster on right L1 dermatome; no other rash observed. Rash beginning to scab over.  Musculoskeletal: Good pedal edema     Data Reviewed: Basic Metabolic Panel:  Recent Labs Lab 04/07/13 0708 04/08/13 0456 04/09/13 0803 04/10/13 0530  NA 128* 133* 135* 135*  K 4.8 4.5 3.8 4.3  CL 95* 101 102 105  CO2 22 23 23 19   GLUCOSE 181* 111* 120* 208*  BUN 27* 23 16 19   CREATININE 1.46* 1.25 1.10 1.37*  CALCIUM 8.6 8.8 8.8 8.6  MG  --  1.6 1.5 1.5   Liver Function Tests:  Recent Labs Lab 04/07/13 0708 04/08/13 0456 04/09/13 0803 04/10/13 0530  AST 21 21 20 18   ALT 9 8 8 8   ALKPHOS 66 63 63 61  BILITOT 0.7 0.3 0.3 0.2*  PROT 5.5* 5.5* 5.8* 5.0*  ALBUMIN 2.6* 2.5* 2.5* 2.2*   No results found for this basename: LIPASE, AMYLASE,  in the last 168 hours No results found for this basename: AMMONIA,  in the last 168 hours CBC:  Recent Labs Lab 04/07/13 0708 04/08/13 0456 04/09/13 0803 04/10/13 0530  WBC 4.6 2.4* 2.2* 2.5*  NEUTROABS 4.0 1.7 1.4* 1.4*  HGB 11.4* 11.2* 11.0* 10.2*  HCT 32.7* 32.6* 32.0* 28.7*  MCV 89.1 89.3 88.6 88.3  PLT 93* 85* 107* 123*   Cardiac Enzymes: No results found for this basename: CKTOTAL, CKMB, CKMBINDEX, TROPONINI,  in the last 168 hours BNP (last 3 results) No results found for this basename: PROBNP,  in the last 8760 hours CBG:  Recent Labs Lab 04/09/13 1749 04/09/13 1946 04/09/13 2312 04/10/13 0524 04/10/13 0728  GLUCAP 245* 264* 292* 185* 157*    Recent Results (from the past 240 hour(s))  CULTURE, BLOOD (ROUTINE X 2)     Status: None   Collection Time    04/07/13  7:08 AM      Result Value Ref Range Status   Specimen Description BLOOD LEFT ARM   Final   Special Requests BOTTLES DRAWN AEROBIC AND ANAEROBIC 2ML   Final   Culture  Setup Time      Final   Value: 04/07/2013 10:18     Performed at [facility]   Culture     Final   Value:        BLOOD CULTURE RECEIVED NO GROWTH TO DATE CULTURE WILL BE HELD FOR 5 DAYS BEFORE ISSUING A FINAL NEGATIVE REPORT     Performed at [facility]   Report Status PENDING   Incomplete  CULTURE, BLOOD (ROUTINE X 2)     Status: None   Collection Time    04/07/13  8:15 AM      Result Value Ref Range Status   Specimen Description BLOOD RIGHT ANTECUBITAL   Final   Special Requests BOTTLES DRAWN AEROBIC AND ANAEROBIC 5ML   Final   Culture  Setup Time  Final   Value: 04/07/2013 10:17     Performed at [facility]   Culture     Final   Value:        BLOOD CULTURE RECEIVED NO GROWTH TO DATE CULTURE WILL BE HELD FOR 5 DAYS BEFORE ISSUING A FINAL NEGATIVE REPORT     Performed at [facility]   Report Status PENDING   Incomplete  URINE CULTURE     Status: None   Collection Time    04/07/13  9:31 AM      Result Value Ref Range Status   Specimen Description URINE, CATHETERIZED   Final   Special Requests NONE   Final   Culture  Setup Time     Final   Value: 04/07/2013 12:58     Performed at [place]     Final   Value: NO GROWTH     Performed at [facility]   Culture     Final   Value: NO GROWTH     Performed at [facility]   Report Status 04/08/2013 FINAL   Final  URINE CULTURE     Status: None   Collection Time    04/07/13 10:10 PM      Result Value Ref Range Status   Specimen Description URINE, CLEAN CATCH   Final   Special Requests NONE   Final   Culture  Setup Time     Final   Value: 04/08/2013 02:13     Performed at [place]     Final   Value: >=100,000 COLONIES/ML     Performed at [facility]   Culture     Final   Value: Multiple bacterial morphotypes present, none predominant. Suggest appropriate recollection if clinically indicated.     Performed at [facility]   Report Status 04/08/2013 FINAL   Final     Studies: No results found.  Scheduled Meds: . aspirin EC  81 mg Oral Daily  . gabapentin  300 mg Oral BID  . insulin aspart  0-9 Units Subcutaneous 6 times per day  . insulin glargine  12 Units Subcutaneous QHS  . levothyroxine  112 mcg Oral QAC breakfast  . megestrol  800 mg Oral Daily  . pantoprazole  40 mg Oral Daily  . polyethylene glycol  17 g Oral Daily  . simvastatin  20 mg Oral QPM  . valACYclovir  1,000 mg Oral TID  . Warfarin - Pharmacist Dosing Inpatient   Does not apply q1800   Continuous Infusions: . sodium chloride 1,000 mL (04/10/13 0526)    Principal Problem:   Shingles outbreak Active Problems:   Malignant neoplasm of lower third of esophagus   Atrial fibrillation with RVR   Hyponatremia   Dehydration   Chronic kidney disease, stage 3   DM type 2 (diabetes mellitus, type 2)   Metastatic adenocarcinoma to liver   Zoster    Time spent: 40 minute    WOODS, [place], J  [facility] Pager [phone]. If 7PM-7AM, please contact night-coverage at [email or web address], password [facility] 04/10/2013, 12:27 PM  LOS: 3 days

## 2013-04-10 NOTE — Progress Notes (Signed)
ANTICOAGULATION CONSULT NOTE - Follow up  Pharmacy Consult for warfarin Indication: atrial fibrillation  No Known Allergies  Patient Measurements: Height: 6' (182.9 cm) Weight: 181 lb 10.5 oz (82.4 kg) IBW/kg (Calculated) : 77.6   Vital Signs: Temp: 98.1 F (36.7 C) (02/15 0530) Temp src: Oral (02/15 0530) BP: 136/66 mmHg (02/15 0530) Pulse Rate: 98 (02/15 0530)  Labs:  Recent Labs  04/08/13 0456 04/08/13 1345 04/09/13 0803 04/10/13 0530  HGB 11.2*  --  11.0* 10.2*  HCT 32.6*  --  32.0* 28.7*  PLT 85*  --  107* 123*  LABPROT  --  30.7* 27.0* 24.1*  INR  --  3.08* 2.61* 2.24*  CREATININE 1.25  --  1.10 1.37*    Estimated Creatinine Clearance: 46.4 ml/min (by C-G formula based on Cr of 1.37).   Assessment: 80 y/o M on chronic warfarin therapy for atrial fibrillation and hx of CVA admitted 2/12 with fever, weakness, and worsening herpes zoster lesions. Patient also on chemotherapy for esophageal cancer with liver metastases. Pharmacy has been consulted to dose warfarin while inpatient. Concomitant low-dose ASA 81 mg daily is noted.  Most recent warfarin dosage prior to admission was 5 mg daily, last taken on 2/11.     INR on admission is therapeutic (2.59)  INR today = 2.24, therapeutic.  Pt has received coumadin 5mg  (2/12), 0mg  (2/13), 4mg  (2/14).    Hgb slight drop to 10.2, plts improving  No bleeding has been noted  SCr increased  Goal of Therapy:  INR 2-3 Monitor platelets   Plan:  Repeat coumadin 4mg  po x 1 tonight  Thank you for the consult.  Ralene Bathe, PharmD, BCPS 04/10/2013, 1:50 PM  Pager: [phone]

## 2013-04-11 LAB — CBC WITH DIFFERENTIAL/PLATELET
BASOS PCT: 1 % (ref 0–1)
Basophils Absolute: 0 10*3/uL (ref 0.0–0.1)
EOS ABS: 0.1 10*3/uL (ref 0.0–0.7)
EOS PCT: 2 % (ref 0–5)
HCT: 29.8 % — ABNORMAL LOW (ref 39.0–52.0)
Hemoglobin: 10.6 g/dL — ABNORMAL LOW (ref 13.0–17.0)
Lymphocytes Relative: 29 % (ref 12–46)
Lymphs Abs: 0.8 10*3/uL (ref 0.7–4.0)
MCH: 31.3 pg (ref 26.0–34.0)
MCHC: 35.6 g/dL (ref 30.0–36.0)
MCV: 87.9 fL (ref 78.0–100.0)
Monocytes Absolute: 0.4 10*3/uL (ref 0.1–1.0)
Monocytes Relative: 15 % — ABNORMAL HIGH (ref 3–12)
Neutro Abs: 1.4 10*3/uL — ABNORMAL LOW (ref 1.7–7.7)
Neutrophils Relative %: 53 % (ref 43–77)
PLATELETS: 133 10*3/uL — AB (ref 150–400)
RBC: 3.39 MIL/uL — ABNORMAL LOW (ref 4.22–5.81)
RDW: 14.5 % (ref 11.5–15.5)
WBC: 2.7 10*3/uL — ABNORMAL LOW (ref 4.0–10.5)

## 2013-04-11 LAB — COMPREHENSIVE METABOLIC PANEL
ALT: 10 U/L (ref 0–53)
AST: 19 U/L (ref 0–37)
Albumin: 2.3 g/dL — ABNORMAL LOW (ref 3.5–5.2)
Alkaline Phosphatase: 62 U/L (ref 39–117)
BUN: 16 mg/dL (ref 6–23)
CO2: 23 mEq/L (ref 19–32)
Calcium: 8.4 mg/dL (ref 8.4–10.5)
Chloride: 102 mEq/L (ref 96–112)
Creatinine, Ser: 1.18 mg/dL (ref 0.50–1.35)
GFR calc Af Amer: 65 mL/min — ABNORMAL LOW (ref >90)
GFR calc non Af Amer: 56 mL/min — ABNORMAL LOW (ref >90)
Glucose, Bld: 278 mg/dL — ABNORMAL HIGH (ref 70–99)
Potassium: 4.6 mEq/L (ref 3.7–5.3)
Sodium: 134 mEq/L — ABNORMAL LOW (ref 137–147)
Total Bilirubin: 0.2 mg/dL — ABNORMAL LOW (ref 0.3–1.2)
Total Protein: 5.3 g/dL — ABNORMAL LOW (ref 6.0–8.3)

## 2013-04-11 LAB — PROTIME-INR
INR: 2.16 — ABNORMAL HIGH (ref 0.00–1.49)
Prothrombin Time: 23.4 seconds — ABNORMAL HIGH (ref 11.6–15.2)

## 2013-04-11 LAB — MAGNESIUM: Magnesium: 1.4 mg/dL — ABNORMAL LOW (ref 1.5–2.5)

## 2013-04-11 LAB — GLUCOSE, CAPILLARY: Glucose-Capillary: 221 mg/dL — ABNORMAL HIGH (ref 70–99)

## 2013-04-11 MED ORDER — POLYETHYLENE GLYCOL 3350 17 G PO PACK: 17.0000 g | PACK | Freq: Two times a day (BID) | ORAL | Status: DC

## 2013-04-11 MED ORDER — VALACYCLOVIR HCL 1 G PO TABS: 1000.0000 mg | ORAL_TABLET | Freq: Three times a day (TID) | ORAL | Status: DC

## 2013-04-11 MED ORDER — GABAPENTIN 300 MG PO CAPS: 300.0000 mg | ORAL_CAPSULE | Freq: Two times a day (BID) | ORAL | Status: AC

## 2013-04-11 MED ORDER — MEGESTROL ACETATE 400 MG/10ML PO SUSP: 800.0000 mg | Freq: Every day | ORAL | Status: DC

## 2013-04-11 MED ORDER — ASPIRIN 81 MG PO TBEC: 81.0000 mg | DELAYED_RELEASE_TABLET | Freq: Every day | ORAL | Status: DC

## 2013-04-11 MED ORDER — HYDROCORTISONE 0.5 % EX CREA: TOPICAL_CREAM | Freq: Two times a day (BID) | CUTANEOUS | Status: DC | PRN

## 2013-04-11 MED ORDER — METOPROLOL TARTRATE 12.5 MG HALF TABLET: 12.5000 mg | ORAL_TABLET | Freq: Two times a day (BID) | ORAL | Status: DC

## 2013-04-11 NOTE — Progress Notes (Signed)
Consult: wafarin Indication: atrial fibrillation and CVA   23 yoM continued on warfarin this admission for h/o afib and CVA. Patient was reportedly receiving warfarin 5mg  po daily PTA and INR has been therapeutic the past couple of days. Patient is ready for discharge today. Discharge summaries completed and patient will be resuming on home dose of warfarin at discharge.   Pharmacy will not enter tonight's dose of warfarin. RN, please call pharmacy at [facility] for warfarin dose if patient still here.   Vanessa Hanover, PharmD, BCPS Pager: [phone] 11:05 AM Pharmacy #: [id number]

## 2013-04-11 NOTE — Discharge Summary (Signed)
Physician Discharge Summary  RAYVION KLINKER [id number] DOB: January 07, 1930 DOA: 04/07/2013  PCP: Doree Fudge, MD  Admit date: 04/07/2013 Discharge date: 04/11/2013  Time spent: 40 minutes  Recommendations for Outpatient Follow-up:   Shingles  -Continue valacyclovir ; complete seven-day course -Consider hydrocortisone topical cream PRN; provide at discharge for pruritic lesions  -Continue Neurontin 100mg  BID for neuropathic pain  -Followup with PCP in one week  Malignant neoplasm lower third esophagus  -Consulted oncology (Dr. Lisbeth Renshaw) and informed him patient had been hospitalized  -Stable per patient last XRT November 2014, last chemotherapy 6 February  -blood cultures NTD  -urine culture NTD  -Followup with Dr. Betsy Coder (oncology)    Metastatic adenocarcinoma to liver -See malignant neoplasm esophagus   Atrial fibrillation with RVR  -Coumadin per pharmacy  -Start metoprolol 12.5 mg BID   Diabetes type 2  -Sensitive SSI   HLD  -Continue Zocor 20 mg  -Obtain lipid panel   Hypothyroidism  -Continue Synthroid 17mcg   Hyponatremia  -Resolved with hydration    HTN/atrial fibrillation  - Continue metoprolol 12.5 mg BID  -PCP to titrate to effect  Constipation  -Continue MiraLax to BID  -Continue Dulcolax suppository PRN   Leukopenia  -Will followup with oncologist    Generalized weakness  -Improved with hydration and increase caloric intake       Discharge Diagnoses:  Principal Problem:   Shingles outbreak Active Problems:   Malignant neoplasm of lower third of esophagus   Atrial fibrillation with RVR   Hyponatremia   Dehydration   Chronic kidney disease, stage 3   DM type 2 (diabetes mellitus, type 2)   Metastatic adenocarcinoma to liver   Zoster   Discharge Condition: Stable   Diet recommendation: American diabetic Association  Filed Weights   04/07/13 0642 04/07/13 1342  Weight: 83.008 kg (183 lb) 82.4 kg (181 lb 10.5 oz)     History of present illness:  Luis Mclean is a 80 y.o. 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 presents with fever, malaise, nausea, vomiting. His wife diagnosed with shingles last night and had a cycle of your colon by the PCP. He has history of esophageal cancer with metastases to the liver. He is currently on chemotherapy, last session last week.  He attempts of 102 at home. He's had a few episodes of vomiting this morning. He is afebrile here, but had antipyretics within the past 6 hours. Hears pulses mildly tachycardic between 95 and 105. His blood pressure is soft with systolic in the 0000000 and diastolics in the 123456 and 123456. His lungs are clear, he is mentating well and relaxing constantly. Patient does have shingles rash on his right groin and lateral hip and buttock area. There is no other breath other rash.  Sepsis protocol initiated with blood and urine cultures. I do not feel he needs a lumbar puncture at this time as he is mentating well and having no headaches. His set of antibiotics initially, also with acyclovir as you can see shingles this is worse. If we find a UTI or pneumonia, we will initiate antibiotics. Patient states fell multiple times yesterday, wife caught him on one of the occasions and help them down. Negative LOC. Negative head trauma 2/15 patient eating each meal secondary to increased appetite from the Megace. States feels constipated and requests a suppository. 2/16 patient doing well, request to be discharged prior to today snowfall. States  eating much better since starting the Megace, the burning sensation of the zoster rash controlled with the current level of Neurontin.     Consultants:  Dr. Betsy Coder (oncology)    Procedures:  [facility] 04/07/2013  Mild atherosclerotic calcification of the aortic arch. Heart size  within normal limits for projection. The lungs appear clear.   Thoracic spondylosis is present.     Antibiotics:  Valacyclovir 2/12>>    Discharge Exam: Filed Vitals:   04/10/13 2105 04/11/13 0621 04/11/13 0624 04/11/13 0629  BP: 148/69 148/89 138/91 123/73  Pulse: 95 86 98 101  Temp: 98.9 F (37.2 C) 97.8 F (36.6 C)    TempSrc: Oral Oral    Resp: 18 20    Height:      Weight:      SpO2: 99% 99%     General: A./O. x4, NAD  Cardiovascular: Irregular irregular rhythm and rate, negative murmurs rubs or gallops, DP/PT pulse plus one  Respiratory: Clear to auscultation bilaterally  Abdomen: Soft, nontender, nondistended, plus bowel  Skin: Rash consistent with zoster on right L1 dermatome; no other rash observed. Rash beginning to scab over.  Musculoskeletal: Good pedal edema    Discharge Instructions   Future Appointments Provider Department Dept Phone   04/15/2013 11:15 AM [facility] [phone]   04/15/2013 11:45 AM Ladell Pier, MD [facility] [phone]   04/15/2013 12:45 PM [facility] [phone]       Medication List    ASK your doctor about these medications       acetaminophen 500 MG tablet  Commonly known as:  TYLENOL  Take 1,000 mg by mouth every 6 (six) hours as needed for mild pain or moderate pain.     ALPRAZolam 0.25 MG tablet  Commonly known as:  XANAX  Take 0.25 mg by mouth at bedtime as needed for anxiety or sleep.     gabapentin 300 MG capsule  Commonly known as:  NEURONTIN  Take 300 mg by mouth at bedtime.     HYDROcodone-acetaminophen 5-325 MG per tablet  Commonly known as:  NORCO/VICODIN  Take 0.5-1 tablets by mouth every 4 (four) hours as needed for moderate pain.     insulin glargine 100 UNIT/ML injection  Commonly known as:  LANTUS  Inject 0.12 mLs (12 Units total) into the skin at bedtime.     levothyroxine 112 MCG tablet  Commonly known as:  SYNTHROID,  LEVOTHROID  Take 112 mcg by mouth daily before breakfast.     omeprazole 20 MG capsule  Commonly known as:  PRILOSEC  Take 1 capsule (20 mg total) by mouth 2 (two) times daily before a meal.     prochlorperazine 5 MG tablet  Commonly known as:  COMPAZINE  Take 1 tablet (5 mg total) by mouth every 6 (six) hours as needed for nausea.     simvastatin 40 MG tablet  Commonly known as:  ZOCOR  Take 20 mg by mouth every evening.     sucralfate 1 G tablet  Commonly known as:  CARAFATE  Take 1 tablet (1 g total) by mouth 4 (four) times daily.     valACYclovir 1000 MG tablet  Commonly known as:  VALTREX  Take 1,000 mg by mouth 3 (three) times daily.     warfarin 5 MG tablet  Commonly known as:  COUMADIN  Take 1 tablet (5 mg total) by mouth  daily. Or as directed by MD.       No Known Allergies    The results of significant diagnostics from this hospitalization (including imaging, microbiology, ancillary and laboratory) are listed below for reference.    Significant Diagnostic Studies: Dg Chest Port 1 View  04/07/2013   CLINICAL DATA:  Shortness of breath. Fever. Hypertension. Esophageal cancer  EXAM: PORTABLE CHEST - 1 VIEW  COMPARISON:  DG CHEST 2 VIEW dated 01/09/2013; NM PET IMAGE INITIAL (PI) SKULL BASE TO THIGH dated 12/02/2012  FINDINGS: Mild atherosclerotic calcification of the aortic arch. Heart size within normal limits for projection. The lungs appear clear. Thoracic spondylosis is present.  IMPRESSION: 1. Mild atherosclerotic calcification of the aortic arch. No acute findings.   Electronically Signed   By: Sherryl Barters M.D.   On: 04/07/2013 07:32    Microbiology: Recent Results (from the past 240 hour(s))  CULTURE, BLOOD (ROUTINE X 2)     Status: None   Collection Time    04/07/13  7:08 AM      Result Value Ref Range Status   Specimen Description BLOOD LEFT ARM   Final   Special Requests BOTTLES DRAWN AEROBIC AND ANAEROBIC 2ML   Final   Culture  Setup Time      Final   Value: 04/07/2013 10:18     Performed at [facility]   Culture     Final   Value:        BLOOD CULTURE RECEIVED NO GROWTH TO DATE CULTURE WILL BE HELD FOR 5 DAYS BEFORE ISSUING A FINAL NEGATIVE REPORT     Performed at [facility]   Report Status PENDING   Incomplete  CULTURE, BLOOD (ROUTINE X 2)     Status: None   Collection Time    04/07/13  8:15 AM      Result Value Ref Range Status   Specimen Description BLOOD RIGHT ANTECUBITAL   Final   Special Requests BOTTLES DRAWN AEROBIC AND ANAEROBIC 5ML   Final   Culture  Setup Time     Final   Value: 04/07/2013 10:17     Performed at [facility]   Culture     Final   Value:        BLOOD CULTURE RECEIVED NO GROWTH TO DATE CULTURE WILL BE HELD FOR 5 DAYS BEFORE ISSUING A FINAL NEGATIVE REPORT     Performed at [facility]   Report Status PENDING   Incomplete  URINE CULTURE     Status: None   Collection Time    04/07/13  9:31 AM      Result Value Ref Range Status   Specimen Description URINE, CATHETERIZED   Final   Special Requests NONE   Final   Culture  Setup Time     Final   Value: 04/07/2013 12:58     Performed at [place]     Final   Value: NO GROWTH     Performed at [facility]   Culture     Final   Value: NO GROWTH     Performed at [facility]   Report Status 04/08/2013 FINAL   Final  URINE CULTURE     Status: None   Collection Time    04/07/13 10:10 PM      Result Value Ref Range Status   Specimen Description URINE, CLEAN CATCH   Final   Special Requests NONE   Final  Culture  Setup Time     Final   Value: 04/08/2013 02:13     Performed at [place]     Final   Value: >=100,000 COLONIES/ML     Performed at [facility]   Culture     Final   Value: Multiple bacterial morphotypes present, none predominant. Suggest appropriate recollection if clinically indicated.     Performed at [facility]   Report Status 04/08/2013 FINAL   Final     Labs: Basic Metabolic Panel:  Recent Labs Lab 04/07/13 0708 04/08/13 0456 04/09/13 0803 04/10/13 0530 04/11/13 0510  NA 128* 133* 135* 135* 134*  K 4.8 4.5 3.8 4.3 4.6  CL 95* 101 102 105 102  CO2 22 23 23 19 23   GLUCOSE 181* 111* 120* 208* 278*  BUN 27* 23 16 19 16   CREATININE 1.46* 1.25 1.10 1.37* 1.18  CALCIUM 8.6 8.8 8.8 8.6 8.4  MG  --  1.6 1.5 1.5 1.4*   Liver Function Tests:  Recent Labs Lab 04/07/13 0708 04/08/13 0456 04/09/13 0803 04/10/13 0530 04/11/13 0510  AST 21 21 20 18 19   ALT 9 8 8 8 10   ALKPHOS 66 63 63 61 62  BILITOT 0.7 0.3 0.3 0.2* 0.2*  PROT 5.5* 5.5* 5.8* 5.0* 5.3*  ALBUMIN 2.6* 2.5* 2.5* 2.2* 2.3*   No results found for this basename: LIPASE, AMYLASE,  in the last 168 hours No results found for this basename: AMMONIA,  in the last 168 hours CBC:  Recent Labs Lab 04/07/13 0708 04/08/13 0456 04/09/13 0803 04/10/13 0530 04/11/13 0510  WBC 4.6 2.4* 2.2* 2.5* 2.7*  NEUTROABS 4.0 1.7 1.4* 1.4* 1.4*  HGB 11.4* 11.2* 11.0* 10.2* 10.6*  HCT 32.7* 32.6* 32.0* 28.7* 29.8*  MCV 89.1 89.3 88.6 88.3 87.9  PLT 93* 85* 107* 123* 133*   Cardiac Enzymes: No results found for this basename: CKTOTAL, CKMB, CKMBINDEX, TROPONINI,  in the last 168 hours BNP: BNP (last 3 results) No results found for this basename: PROBNP,  in the last 8760 hours CBG:  Recent Labs Lab 04/10/13 0728 04/10/13 1227 04/10/13 1749 04/10/13 2114 04/11/13 0756  GLUCAP 157* 187* 305* 282* 221*       Signed:  Dia Crawford, MD [facility] [phone] pager

## 2013-04-11 NOTE — Evaluation (Addendum)
Physical Therapy Evaluation Patient Details Name: Luis Mclean MRN: [id number] DOB: [birth year] Today's Date: 04/11/2013 Time: 0938-1829 PT Time Calculation (min): 32 min  PT Assessment / Plan / Recommendation History of Present Illness  80 yo male admitted with shingles-R groin/hip//buttocks. Hx of DM, esophageal cancer, Afib with RVR, CVA, R foot drop  Clinical Impression  On eval, pt required Min assist for mobility-able to ambulate ~60 feet with rolling walker (Remained in room due to airborne precautions). Demonstrates general weakness, decreased activity tolerance and impaired gait and balance. Discussed therapist's recommendation for HHPT-pt and wife declined. Pt/wife hope to d/c home later today. Encouraged pt to continue to use walker for ambulation safety.     PT Assessment  Patient needs continued PT services    Follow Up Recommendations  No PT follow up;Supervision/Assistance - 24 hour (discussed HHPT with pt/wife-declined HHPT)    Does the patient have the potential to tolerate intense rehabilitation      Barriers to Discharge        Equipment Recommendations  None recommended by PT    Recommendations for Other Services OT consult   Frequency Min 3X/week    Precautions / Restrictions Precautions Precautions: Fall Precaution Comments: R foot drop Restrictions Weight Bearing Restrictions: No   Pertinent Vitals/Pain Pt denies pain       Mobility  Bed Mobility Overal bed mobility: Modified Independent General bed mobility comments: HOB elevated. Increased time.  Transfers Overall transfer level: Needs assistance Equipment used: Rolling walker (2 wheeled) Transfers: Sit to/from Stand Sit to Stand: Min assist General transfer comment: Assist to rise, stabilize, control descent. Multiple attempts before pt could get up on his feet. Weight shifted posteriorly. Wide BOS Ambulation/Gait Ambulation/Gait assistance: Min assist Ambulation Distance (Feet): 60  Feet Assistive device: Rolling walker (2 wheeled) General Gait Details: slow gait speed. Unsteady. Assist to stabilize pt initially-progressed to Min guard assist as distance increased. Ambulation in room only due to airborne precautions-took several trips back and forth in room.     Exercises     PT Diagnosis: Difficulty walking;Abnormality of gait;Generalized weakness  PT Problem List: Decreased strength;Decreased activity tolerance;Decreased balance;Decreased mobility;Decreased knowledge of use of DME;Pain PT Treatment Interventions: DME instruction;Gait training;Functional mobility training;Therapeutic activities;Therapeutic exercise;Patient/family education     PT Goals(Current goals can be found in the care plan section) Acute Rehab PT Goals Patient Stated Goal: home today PT Goal Formulation: With patient/family Time For Goal Achievement: 04/25/13 Potential to Achieve Goals: Good  Visit Information  Last PT Received On: 04/11/13 Assistance Needed: +1 History of Present Illness: 80 yo male admitted with shingles-R groin/hip//buttocks. Hx of DM, esophageal cancer, Afib with RVR, CVA, R foot drop       Prior Functioning  Home Living Family/patient expects to be discharged to:: Private residence Living Arrangements: Spouse/significant other Available Help at Discharge: Family Type of Home: House Home Access: Stairs to enter Technical brewer of Steps: 2 Entrance Stairs-Rails: Right Home Layout: Able to live on main level with bedroom/bathroom Home Equipment: Walker - 2 wheels;Bedside commode;Shower seat Prior Function Level of Independence: Independent with assistive device(s) Comments: normally independent however daughter states pt has been using RW recently due to weakness. usually able to do own ADL but pt states wife has been helping for last couple of weeks more Communication Communication: No difficulties    Cognition  Cognition Arousal/Alertness:  Awake/alert Behavior During Therapy: WFL for tasks assessed/performed Overall Cognitive Status: Within Functional Limits for tasks assessed    Extremity/Trunk Assessment Upper  Extremity Assessment Upper Extremity Assessment: Defer to OT evaluation Lower Extremity Assessment Lower Extremity Assessment: Generalized weakness;RLE deficits/detail RLE Deficits / Details: R DF/PF 0/5 Cervical / Trunk Assessment Cervical / Trunk Assessment: Kyphotic   Balance Balance Overall balance assessment: History of Falls;Needs assistance Sitting-balance support: Bilateral upper extremity supported;Feet supported Sitting balance-Leahy Scale: Good Standing balance support: Bilateral upper extremity supported;During functional activity Standing balance-Leahy Scale: Fair  End of Session PT - End of Session Activity Tolerance: Patient tolerated treatment well Patient left: in bed;with call bell/phone within reach;with family/visitor present  GP     Weston Anna, MPT Pager: [phone]

## 2013-04-11 NOTE — Evaluation (Signed)
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 The patient states that he has had odynophagia and increasing difficulty eating and drinking since the start of radiation therapy for his esophageal adenocarcinoma. He describes a burning in his chest which worsened in the past few days. Last evening, he experienced dry heaves. He checked his blood pressure with his automated machine at home. He states that his heart rate was in the 258N with systolic blood pressure in the 130s. Because of his decreased by mouth intake and worsening weakness, he came to the emergency department for further evaluation. His last radiation therapy was on 01/05/2013. His last chemotherapy was 2 weeks ago. He complained of some chest discomfort as well as shortness of breath last night. He states that his breathing has improved. He also had some dizziness without any syncope or falls. He denied any abdominal pain, dysuria, hematuria, headache, visual disturbance, hematochezia, melena, hemoptysis, coughing. His temperature at home was 100.57F.   Clinical Impression   Pt. States that he is at baseline for ADLs and mobility. Pt. Has slightly upsteady gait when backing up but does not want HHOT at d/c. Pt. States he has been to SNF and has had [place] recently and it did no good. No further acute OT needed at this time.  Will not recommend any HH services per pt. Request.   OT Assessment  Patient does not need any further OT services    Follow Up Recommendations  No OT follow up     Barriers to Discharge      Equipment Recommendations  None recommended by OT    Recommendations for Other Services    Frequency       Precautions / Restrictions Precautions Precautions: Fall Restrictions Weight Bearing Restrictions: No   Pertinent Vitals/Pain No c/o.    ADL  Eating/Feeding: Simulated;Independent Where Assessed - Eating/Feeding: Edge of bed Grooming: Performed;Wash/dry hands;Wash/dry face Where Assessed - Grooming: Unsupported sitting Upper Body Bathing: Simulated;Set up Where Assessed - Upper Body Bathing: Unsupported sitting Lower Body Bathing: Simulated;Set up Where Assessed - Lower Body Bathing: Unsupported sitting;Unsupported standing Upper Body Dressing: Performed;Set up Where Assessed - Upper Body Dressing: Unsupported sitting Lower Body Dressing: Performed;Set up Where Assessed - Lower Body Dressing: Unsupported sitting;Unsupported standing Toilet Transfer: Building services engineer Method: Arts development officer: Comfort height toilet Toileting - Water quality scientist and Hygiene: Simulated;Supervision/safety Where Assessed - Best boy and Hygiene: Sit on 3-in-1 or toilet ADL Comments:  (Pt. 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 The patient states that he has had odynophagia and increasing difficulty eating and drinking since the start of  radiation  therapy for his esophageal adenocarcinoma. He describes a burning in his chest which worsened in the past few days. Last evening, he experienced dry heaves. He checked his blood pressure with his automated machine at home. He states that his heart rate was in the 191Y with systolic blood pressure in the 130s. Because of his decreased by mouth intake and worsening weakness, he came to the emergency department for further evaluation. His last radiation therapy was on 01/05/2013. His last chemotherapy was 2 weeks ago. He complained of some chest discomfort as well as shortness of breath last night. He states that his breathing has improved. He also had some dizziness without any syncope or falls. He denied any abdominal pain, dysuria, hematuria, headache, visual disturbance, hematochezia, melena, hemoptysis, coughing. His temperature at home was 100.64F.       Prior [place] expects to be discharged to:: Private residence Living Arrangements: Spouse/significant other Available Help at Discharge: Family Type of Home: House Home Access: Stairs to enter Technical brewer of Steps: 2 Entrance Stairs-Rails: Right Home Layout: Able to live on main level with bedroom/bathroom Home Equipment: Walker - 2 wheels;Bedside commode;Shower seat Prior Function Level of Independence: Independent with assistive device(s) Comments: normally independent however daughter states pt has been using RW recently due to weakness. usually able to do own ADL but pt states wife has been helping for last couple of weeks more Communication Communication: No difficulties         Vision/Perception Vision - History Baseline Vision: No visual deficits Patient Visual Report: No change from baseline   Cognition  Cognition Arousal/Alertness: Awake/alert Behavior During Therapy: WFL for tasks assessed/performed Overall Cognitive Status: Within Functional Limits for tasks assessed     Extremity/Trunk Assessment Upper Extremity Assessment Upper Extremity Assessment: Generalized weakness     Mobility Transfers Overall transfer level: Modified independent Equipment used: Rolling walker (2 wheeled)     Exercise     Balance     End of Session OT - End of Session Equipment Utilized During Treatment: Rolling walker Activity Tolerance: Patient tolerated treatment well Patient left: in bed Nurse Communication:  (ok working with pt. )  GO     Thien Berka 04/11/2013, 8:08 AM

## 2013-04-13 LAB — CULTURE, BLOOD (ROUTINE X 2)
CULTURE: NO GROWTH
Culture: NO GROWTH

## 2013-04-15 ENCOUNTER — Telehealth: Payer: Self-pay | Admitting: Oncology

## 2013-04-15 ENCOUNTER — Other Ambulatory Visit (HOSPITAL_BASED_OUTPATIENT_CLINIC_OR_DEPARTMENT_OTHER): Payer: Medicare Other

## 2013-04-15 ENCOUNTER — Ambulatory Visit (HOSPITAL_BASED_OUTPATIENT_CLINIC_OR_DEPARTMENT_OTHER): Payer: Medicare Other

## 2013-04-15 ENCOUNTER — Ambulatory Visit (HOSPITAL_BASED_OUTPATIENT_CLINIC_OR_DEPARTMENT_OTHER): Payer: Medicare Other | Admitting: Oncology

## 2013-04-15 VITALS — BP 113/58 | HR 103 | Temp 97.3°F | Resp 18 | Ht 72.0 in | Wt 187.0 lb

## 2013-04-15 DIAGNOSIS — I4891 Unspecified atrial fibrillation: Secondary | ICD-10-CM

## 2013-04-15 DIAGNOSIS — C787 Secondary malignant neoplasm of liver and intrahepatic bile duct: Secondary | ICD-10-CM

## 2013-04-15 DIAGNOSIS — B028 Zoster with other complications: Secondary | ICD-10-CM

## 2013-04-15 DIAGNOSIS — C155 Malignant neoplasm of lower third of esophagus: Secondary | ICD-10-CM

## 2013-04-15 DIAGNOSIS — E119 Type 2 diabetes mellitus without complications: Secondary | ICD-10-CM

## 2013-04-15 DIAGNOSIS — C159 Malignant neoplasm of esophagus, unspecified: Secondary | ICD-10-CM

## 2013-04-15 DIAGNOSIS — Z7901 Long term (current) use of anticoagulants: Secondary | ICD-10-CM

## 2013-04-15 DIAGNOSIS — Z5111 Encounter for antineoplastic chemotherapy: Secondary | ICD-10-CM

## 2013-04-15 DIAGNOSIS — N289 Disorder of kidney and ureter, unspecified: Secondary | ICD-10-CM

## 2013-04-15 LAB — CBC WITH DIFFERENTIAL/PLATELET
BASO%: 0 % (ref 0.0–2.0)
Basophils Absolute: 0 10*3/uL (ref 0.0–0.1)
EOS%: 1.9 % (ref 0.0–7.0)
Eosinophils Absolute: 0.1 10*3/uL (ref 0.0–0.5)
HCT: 34 % — ABNORMAL LOW (ref 38.4–49.9)
HGB: 11.6 g/dL — ABNORMAL LOW (ref 13.0–17.1)
LYMPH%: 21.3 % (ref 14.0–49.0)
MCH: 30.4 pg (ref 27.2–33.4)
MCHC: 34.1 g/dL (ref 32.0–36.0)
MCV: 89.2 fL (ref 79.3–98.0)
MONO#: 1.3 10*3/uL — ABNORMAL HIGH (ref 0.1–0.9)
MONO%: 29.7 % — ABNORMAL HIGH (ref 0.0–14.0)
NEUT#: 2 10*3/uL (ref 1.5–6.5)
NEUT%: 47.1 % (ref 39.0–75.0)
PLATELETS: 212 10*3/uL (ref 140–400)
RBC: 3.81 10*6/uL — AB (ref 4.20–5.82)
RDW: 15.5 % — ABNORMAL HIGH (ref 11.0–14.6)
WBC: 4.3 10*3/uL (ref 4.0–10.3)
lymph#: 0.9 10*3/uL (ref 0.9–3.3)

## 2013-04-15 LAB — PROTIME-INR
INR: 1.8 — AB (ref 2.00–3.50)
Protime: 21.6 Seconds — ABNORMAL HIGH (ref 10.6–13.4)

## 2013-04-15 MED ORDER — SODIUM CHLORIDE 0.9 % IV SOLN
100.0000 mg | Freq: Once | INTRAVENOUS | Status: AC
  Administered 2013-04-15: 100 mg via INTRAVENOUS
  Filled 2013-04-15: qty 10

## 2013-04-15 MED ORDER — DEXAMETHASONE SODIUM PHOSPHATE 10 MG/ML IJ SOLN
5.0000 mg | Freq: Once | INTRAMUSCULAR | Status: AC
  Administered 2013-04-15: 5 mg via INTRAVENOUS

## 2013-04-15 MED ORDER — SODIUM CHLORIDE 0.9 % IV SOLN
40.0000 mg/m2 | Freq: Once | INTRAVENOUS | Status: AC
  Administered 2013-04-15: 90 mg via INTRAVENOUS
  Filled 2013-04-15: qty 15

## 2013-04-15 MED ORDER — ONDANSETRON 16 MG/50ML IVPB (CHCC)
INTRAVENOUS | Status: AC
  Filled 2013-04-15: qty 16

## 2013-04-15 MED ORDER — SODIUM CHLORIDE 0.9 % IV SOLN
Freq: Once | INTRAVENOUS | Status: AC
  Administered 2013-04-15: 14:00:00 via INTRAVENOUS

## 2013-04-15 MED ORDER — FAMOTIDINE IN NACL 20-0.9 MG/50ML-% IV SOLN
20.0000 mg | Freq: Once | INTRAVENOUS | Status: AC
  Administered 2013-04-15: 20 mg via INTRAVENOUS

## 2013-04-15 MED ORDER — DEXAMETHASONE SODIUM PHOSPHATE 10 MG/ML IJ SOLN
INTRAMUSCULAR | Status: AC
  Filled 2013-04-15: qty 1

## 2013-04-15 MED ORDER — DIPHENHYDRAMINE HCL 50 MG/ML IJ SOLN
INTRAMUSCULAR | Status: AC
  Filled 2013-04-15: qty 1

## 2013-04-15 MED ORDER — FAMOTIDINE IN NACL 20-0.9 MG/50ML-% IV SOLN
INTRAVENOUS | Status: AC
  Filled 2013-04-15: qty 50

## 2013-04-15 MED ORDER — DIPHENHYDRAMINE HCL 50 MG/ML IJ SOLN
25.0000 mg | Freq: Once | INTRAMUSCULAR | Status: AC
  Administered 2013-04-15: 25 mg via INTRAVENOUS

## 2013-04-15 MED ORDER — ONDANSETRON 16 MG/50ML IVPB (CHCC)
16.0000 mg | Freq: Once | INTRAVENOUS | Status: AC
  Administered 2013-04-15: 16 mg via INTRAVENOUS

## 2013-04-15 NOTE — Progress Notes (Signed)
   [place]    OFFICE PROGRESS NOTE   INTERVAL HISTORY:   Mr. Burggraf returns for scheduled followup of the esophagus cancer. He was admitted 04/07/2013 with nausea/vomiting, fever, weakness, and a zoster rash. No apparent source for infection was identified to He was discharged on 04/11/2013. He reports a zoster rash is healing and he has less pain. He is eating without difficulty. He is ambulatory with a walker in the home.  Objective:  Vital signs in last 24 hours:  Blood pressure 113/58, pulse 103, temperature 97.3 F (36.3 C), temperature source Oral, resp. rate 18, height 6' (1.829 m), weight 187 lb (84.823 kg), SpO2 99.00%.    HEENT: No thrush Resp: Lungs clear bilaterally Cardio: Irregular GI: No hepatomegaly, nontender Vascular: Trace pitting edema at the low pretibial area and ankle bilaterally  Skin: Healing zoster rash at the right lower back and abdomen     Lab Results:  Lab Results  Component Value Date   WBC 4.3 04/15/2013   HGB 11.6* 04/15/2013   HCT 34.0* 04/15/2013   MCV 89.2 04/15/2013   PLT 212 04/15/2013   NEUTROABS 2.0 04/15/2013   PT/INR 1.8   Medications: I have reviewed the patient's current medications.  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  Initiation of radiation and weekly Taxol/carboplatin 12/06/2012.  Radiation completed 01/05/2013.  CT 01/25/2013 with resolution of upper abdominal adenopathy and a decreased size of the liver metastasis.  Weekly Taxol/carboplatin initiated 03/04/2013. Last treated on 04/01/2013. 2. Indeterminate right hepatic mass on [id number]-20/11/710/RFX-58/83/2549 , hypermetabolic on the PET scan 82/64/1583. Ultrasound biopsy 12/10/2012 with pathology showing a metastatic  poorly differentiated adenocarcinoma of GI primary.  3. Dysphagia/subxiphoid pain secondary to #1. Improved.  4. History of atrial fibrillation-maintained on Coumadin.  5. Renal insufficiency.  6. History of a CVA.  7. 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  8. Diabetes.  9. acute zoster rash at a right lower back dermatome February 2015    Disposition:  Mr. Mines appears well today. He has completed 3 treatments with weekly Taxol/carboplatin since resuming therapy. The plan is to continue weekly Taxol/carboplatin today and again on 04/22/2013. He will be scheduled for a restaging CT prior to an office visit on 05/13/2013. The chemotherapy will be dose reduced secondary to hematologic toxicity causing a delay in treatment.  I doubt the nausea/vomiting on hospital admission last week was related to chemotherapy.   Betsy Coder, MD  04/15/2013  12:19 PM

## 2013-04-15 NOTE — Patient Instructions (Signed)
[facility] Discharge Instructions for Patients Receiving Chemotherapy  Today you received the following chemotherapy agents :  Taxol, Carboplatin.  To help prevent nausea and vomiting after your treatment, we encourage you to take your nausea medication as instructed by your physician.   If you develop nausea and vomiting that is not controlled by your nausea medication, call the clinic.   BELOW ARE SYMPTOMS THAT SHOULD BE REPORTED IMMEDIATELY:  *FEVER GREATER THAN 100.5 F  *CHILLS WITH OR WITHOUT FEVER  NAUSEA AND VOMITING THAT IS NOT CONTROLLED WITH YOUR NAUSEA MEDICATION  *UNUSUAL SHORTNESS OF BREATH  *UNUSUAL BRUISING OR BLEEDING  TENDERNESS IN MOUTH AND THROAT WITH OR WITHOUT PRESENCE OF ULCERS  *URINARY PROBLEMS  *BOWEL PROBLEMS  UNUSUAL RASH Items with * indicate a potential emergency and should be followed up as soon as possible.  Feel free to call the clinic you have any questions or concerns. The clinic phone number is [phone].    

## 2013-04-15 NOTE — Telephone Encounter (Signed)
gv adn printed appt sched and avs forpt for Feb and March....sed added tx.....gv pt barium

## 2013-04-17 ENCOUNTER — Other Ambulatory Visit: Payer: Self-pay | Admitting: Oncology

## 2013-04-22 ENCOUNTER — Other Ambulatory Visit: Payer: Medicare Other

## 2013-04-22 ENCOUNTER — Ambulatory Visit: Payer: Medicare Other

## 2013-04-25 ENCOUNTER — Telehealth: Payer: Self-pay | Admitting: *Deleted

## 2013-04-25 NOTE — Telephone Encounter (Signed)
Not getting out of bed much except to go to bathroom. Reports barely made it to his recliner Friday. Feeling very weak. Has had shingles recently and thinks this may be making him feel worse. Asking if he could hold his chemo on Wednesday to see if he will get stronger? Thinks she would need to have ambulance bring him in. He is eating "OK" and drinks about a quart of fluid/day. Urine is very dark w/odor. Called back after talking with MD: Will hold his treatment this week and give him more time to recover. Push po fluids-needs more than 1 quart/day. He needs to try to get in at least 2 liters/fluid/day. She doubts he will do this, but will try. Reports his blood sugars are in 200's. Stressed to assist him with any ambulation-he is a fall risk. She will call with update next week or sooner if his condition worsens.

## 2013-04-27 ENCOUNTER — Ambulatory Visit: Payer: Medicare Other

## 2013-04-27 ENCOUNTER — Other Ambulatory Visit: Payer: Medicare Other

## 2013-05-03 ENCOUNTER — Telehealth: Payer: Self-pay | Admitting: *Deleted

## 2013-05-03 NOTE — Telephone Encounter (Signed)
Luis Mclean called to give update on Luis Mclean's status.  "Since the hospitalization with shingles he is bed bound.  Does not get up anymore except to stand to urinate and take two steps to the bedside commode.  Still c/o pain to the shingles area (right buttock, upper thigh and rt. Abdomen).  His hands and rt leg shake but this has been coming about over time.  Appetite is good.  The [facility] nurse came by last week and recommends home health therapy again and occupational therapy.  The nurse has given a note to have Luis Mclean or Luis Mclean set these up.  He is shaky with the two steps he takes.  Are  There any resources available to help get him to his appointments on 05-13-2013 for scans and see Luis Mclean?  He does not have any appointments with Luis Mclean."  [facility] recommended.  [place] number given and "Road to Recovery" mentioned.  Home Health  Verses hiring Private duty health care provider mentioned.  Suggested she speak with our social workers.  Luis Mclean thanked me stating she has a place to start.

## 2013-05-04 NOTE — Telephone Encounter (Signed)
Made wife aware that Dr. Benay Spice agrees to home PT/OT if wife still wishes to pursue this. She reports he did get OOB yesterday with walker and ambulate some and plans to do so again today. She will hold off on referral for now. Confirms he has pain medication that should last until his 3/20 visit. Will call if needed sooner. Appreciates RN calling back.

## 2013-05-09 ENCOUNTER — Telehealth: Payer: Self-pay | Admitting: *Deleted

## 2013-05-09 NOTE — Telephone Encounter (Signed)
Pt's wife called reporting "Jim's had a low grade fever since Friday night; still 99.3; I thinks its a UTI"  Pt's wife states that he is trying to drink more fluids but he is shaking and temp is still 99.2.  Pt has CT & MD visit this Friday.  "what does Dr. Benay Spice want [place] to do?"  Discussed with Dr. Benay Spice.  Returned call to pt's wife and referred her to PCP for pt.  Pt's wife verbalized understanding and expressed appreciation for call back.

## 2013-05-11 ENCOUNTER — Telehealth: Payer: Self-pay | Admitting: *Deleted

## 2013-05-11 NOTE — Telephone Encounter (Signed)
Requests RN call [facility]:QPYP and visit on 3/20. Called wife back : too weak to get in car for appointment on Friday. Asking how to contact ambulance. Gave her phone # [phone] for [employer] [facility]. She needs to give them 1 day notice. She reports he has still been having "fever"-temp 99.2-99.8 due to "his normal temp is 97.6". Has been on Cipro per his PCP, but they called her back to report he did not have a UTI. Says he just feels very weak, but he wants to come in Friday.

## 2013-05-12 NOTE — Telephone Encounter (Signed)
Pt's wife called stating " I'm going to cancel the lab, MD, and CT appt for tomorrow because he is so weak"  Pt states she called the ambulance service and it will cost $940 for transportation.  She is requesting for a home health nurse to come to home.  States "he is trembling in his hands and is so weak I have to feed him now; I don't know what's going on with him lately"  Pt's wife states he does not have any fever at this time.  Concerned also with checking his PT/INR.  Last checked 04/15/13 @ [facility].  Note to Dr. Benay Spice.

## 2013-05-12 NOTE — Telephone Encounter (Signed)
Per Dr. Benay Spice; notified pt's wife that MD recommends pt be seen; take to ED or PCP.  Pt's wife states that "well to get him there I would have to call the ambulance and that's a cost.  I took his vitals and they are fine, blood sugar is OK and my son will be out this weekend"  Pt's wife states that she is just going to watch for now.  Re-iterated to pt's wife again that MD recommends pt to be seen due to these changes in his status and she again states that her son will be out and she will "watch for now"  Dr. Benay Spice made aware.

## 2013-05-13 ENCOUNTER — Ambulatory Visit: Payer: Medicare Other | Admitting: Oncology

## 2013-05-13 ENCOUNTER — Other Ambulatory Visit: Payer: Medicare Other

## 2013-05-13 ENCOUNTER — Ambulatory Visit (HOSPITAL_COMMUNITY): Payer: Medicare Other

## 2013-05-16 ENCOUNTER — Telehealth: Payer: Self-pay | Admitting: *Deleted

## 2013-05-16 NOTE — Telephone Encounter (Signed)
Wife called to ask if Dr. Benay Spice could see Luis Mclean tomorrow after his scan since he will require ambulance transport and is difficult and costly to get him in? MD reviewed schedule and was able to make a change in his schedule to accommodate him tomorrow at 0945. Called wife and she agrees to appointment tomorrow and they still agree to have scan as scheduled.

## 2013-05-17 ENCOUNTER — Other Ambulatory Visit: Payer: Self-pay | Admitting: *Deleted

## 2013-05-17 ENCOUNTER — Ambulatory Visit (HOSPITAL_BASED_OUTPATIENT_CLINIC_OR_DEPARTMENT_OTHER): Payer: Medicare Other | Admitting: Oncology

## 2013-05-17 ENCOUNTER — Ambulatory Visit (HOSPITAL_COMMUNITY)
Admission: RE | Admit: 2013-05-17 | Discharge: 2013-05-17 | Disposition: A | Discharge status: Home / Self Care | Payer: Medicare Other | Source: Ambulatory Visit | Attending: Oncology | Admitting: Oncology

## 2013-05-17 ENCOUNTER — Telehealth: Payer: Self-pay | Admitting: Medical Oncology

## 2013-05-17 VITALS — BP 70/48 | HR 90 | Temp 97.6°F | Resp 18 | Ht 72.0 in

## 2013-05-17 DIAGNOSIS — J90 Pleural effusion, not elsewhere classified: Secondary | ICD-10-CM | POA: Insufficient documentation

## 2013-05-17 DIAGNOSIS — N289 Disorder of kidney and ureter, unspecified: Secondary | ICD-10-CM

## 2013-05-17 DIAGNOSIS — R109 Unspecified abdominal pain: Secondary | ICD-10-CM | POA: Diagnosis not present

## 2013-05-17 DIAGNOSIS — I959 Hypotension, unspecified: Secondary | ICD-10-CM

## 2013-05-17 DIAGNOSIS — K5730 Diverticulosis of large intestine without perforation or abscess without bleeding: Secondary | ICD-10-CM | POA: Diagnosis not present

## 2013-05-17 DIAGNOSIS — C159 Malignant neoplasm of esophagus, unspecified: Secondary | ICD-10-CM | POA: Diagnosis present

## 2013-05-17 DIAGNOSIS — C155 Malignant neoplasm of lower third of esophagus: Secondary | ICD-10-CM

## 2013-05-17 DIAGNOSIS — C787 Secondary malignant neoplasm of liver and intrahepatic bile duct: Secondary | ICD-10-CM | POA: Diagnosis not present

## 2013-05-17 MED ORDER — HYDROCODONE-ACETAMINOPHEN 5-325 MG PO TABS: 0.5000 | ORAL_TABLET | ORAL | Status: AC | PRN

## 2013-05-17 MED ORDER — OXYCODONE HCL 5 MG PO TABS: 5.0000 mg | ORAL_TABLET | ORAL | Status: AC | PRN

## 2013-05-17 MED ORDER — IOHEXOL 300 MG/ML  SOLN
100.0000 mL | Freq: Once | INTRAMUSCULAR | Status: AC | PRN
  Administered 2013-05-17: 100 mL via INTRAVENOUS

## 2013-05-17 MED ORDER — ALPRAZOLAM 0.25 MG PO TABS: 0.2500 mg | ORAL_TABLET | Freq: Every evening | ORAL | Status: DC | PRN

## 2013-05-17 NOTE — Progress Notes (Signed)
   [place]    OFFICE PROGRESS NOTE   INTERVAL HISTORY:   He returns for followup of metastatic esophagus cancer. He was last treated with chemotherapy on 04/15/2013. He has been unable to return for followup appointments. He has remained in the bed the majority of the time. He has tremors of the extremities. Mr. Stenzel has developed pain in the upper abdomen that is partially relieved with hydrocodone. He was treated for a urinary tract infection.  Objective:  Vital signs in last 24 hours:  Blood pressure 70/48, pulse 90, temperature 97.6 F (36.4 C), temperature source Oral, resp. rate 18, height 6' (1.829 m), weight 0 lb (0 kg). repeat manual blood pressure-systolic 80    HEENT: The mucous membranes are moist. No thrush. Resp: Lungs clear bilaterally Cardio: Irregular GI: No hepatomegaly, nontender Vascular: No leg edema Neuro: Alert and oriented, resting tremor of the arms and legs. The motor strength appears intact in the legs bilaterally    Portacath/PICC-without erythema  Lab Results:  Lab Results  Component Value Date   WBC 4.3 04/15/2013   HGB 11.6* 04/15/2013   HCT 34.0* 04/15/2013   MCV 89.2 04/15/2013   PLT 212 04/15/2013   NEUTROABS 2.0 04/15/2013   X-rays: Restaging CT of the abdomen on 05/18/2011, compared to 01/25/2013-enlarged paraesophageal lymph node, progressive and new liver metastases  Medications: I have reviewed the patient's current medications.  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  Initiation of radiation and weekly Taxol/carboplatin 12/06/2012.  Radiation completed 01/05/2013.  CT 01/25/2013 with resolution of upper abdominal adenopathy and a decreased size of the liver metastasis.  Weekly  Taxol/carboplatin initiated 03/04/2013. Last treated on 04/15/2013 CT 05/18/2011 confirmed disease progression in the liver and lymph nodes 2. Indeterminate right hepatic mass on [id number]-22/29/7989/QJJ-94/17/4081 , hypermetabolic on the PET scan 44/81/8563. 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  3. Dysphagia/subxiphoid pain secondary to #1. Improved.  4. History of atrial fibrillation-maintained on Coumadin.  5. Renal insufficiency.  6. History of a CVA.  7. 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  8. Diabetes.  9. acute zoster rash at a right lower back dermatome February 2015  10. Hypotension-likely secondary to malnutrition, atrial fibrillation, and? Dehydration. I have a low clinical suspicion for sepsis.   Disposition:  Mr. Olazabal has progressive metastatic esophagus cancer. I discussed the restaging CT findings with Mr. Sobotka and his family. He has a poor performance status. He is not a candidate for second line chemotherapy. I recommend hospice care. Mr. Moga and his family agree. We discussed CPR and ACLS issues. He will be placed on a no CODE BLUE status.  We discussed the risk/benefit of continuing Coumadin. He will discontinue Coumadin and Zocor. We prescribed oxycodone for pain.  We made a referral to the [facility] program. Mr. Heupel will return for an office visit in 3 weeks. His family will contact us in the interim as needed. He may benefit from placement at [facility] in the future.   Betsy Coder, MD  05/17/2013  3:27 PM

## 2013-05-17 NOTE — Telephone Encounter (Signed)
erroneous

## 2013-05-17 NOTE — Progress Notes (Signed)
No longer ambulatory-stays in bed all day and requires maximal assistance to transfer to [facility]. Family requesting home assistance and equipment. Continues to eat 3 very small meals/day-cereal, sandwich, stuffed pepper and drinks water without difficulty

## 2013-05-18 ENCOUNTER — Telehealth: Payer: Self-pay | Admitting: *Deleted

## 2013-05-18 ENCOUNTER — Telehealth: Payer: Self-pay | Admitting: Oncology

## 2013-05-18 NOTE — Telephone Encounter (Signed)
s.w.l pt and advised on April appt....ok and aware....4.14 per pof no 3:15 avail...sched pt for 4.15

## 2013-05-18 NOTE — Telephone Encounter (Signed)
Confirmed with wife to discontinue the Zocor, Coumadin and Lopressor. She understands and agrees. Hospice nurse came yesterday and has started working on things. Hospice RN and Social worker will be there today.

## 2013-05-24 ENCOUNTER — Telehealth: Payer: Self-pay | Admitting: Oncology

## 2013-05-24 NOTE — Telephone Encounter (Signed)
s.w. pt and advised on 4.15 appt time change.....done pt ok adn aware

## 2013-06-07 ENCOUNTER — Telehealth: Payer: Self-pay | Admitting: *Deleted

## 2013-06-07 NOTE — Telephone Encounter (Signed)
Pt's wife called to cancel appt with Ned Card, NP 4/15 due to "he's not able to get out of bed now and hospice is helping [place] with everything"  Wife encouraged to call office if pt needs anything.  She verbalized understanding and Ned Card, NP made aware of cancellation.

## 2013-06-08 ENCOUNTER — Ambulatory Visit: Payer: Medicare Other | Admitting: Nurse Practitioner

## 2013-06-15 ENCOUNTER — Other Ambulatory Visit: Payer: Self-pay | Admitting: *Deleted

## 2013-06-15 DIAGNOSIS — C159 Malignant neoplasm of esophagus, unspecified: Secondary | ICD-10-CM

## 2013-06-15 MED ORDER — SUCRALFATE 1 GM/10ML PO SUSP: 1.0000 g | Freq: Three times a day (TID) | ORAL | Status: AC

## 2013-06-15 MED ORDER — PROCHLORPERAZINE MALEATE 5 MG PO TABS: 5.0000 mg | ORAL_TABLET | Freq: Four times a day (QID) | ORAL | Status: AC | PRN

## 2013-06-15 MED ORDER — ALPRAZOLAM 0.25 MG PO TABS: 0.2500 mg | ORAL_TABLET | Freq: Every evening | ORAL | Status: AC | PRN

## 2013-06-15 MED ORDER — BISACODYL 10 MG RE SUPP: 10.0000 mg | RECTAL | Status: AC | PRN

## 2013-06-15 MED ORDER — MEGESTROL ACETATE 400 MG/10ML PO SUSP: 800.0000 mg | Freq: Every day | ORAL | Status: AC

## 2013-06-15 NOTE — Telephone Encounter (Signed)
Call from [place], hospice RN requesting refills. Also requesting new Rx for Carafate. Pt reports burning when he swallows. Reviewed with Dr. Benay Spice, order received. Called to [place].

## 2013-06-17 ENCOUNTER — Telehealth: Payer: Self-pay | Admitting: *Deleted

## 2013-06-17 MED ORDER — CIPROFLOXACIN HCL 500 MG PO TABS: 500.0000 mg | ORAL_TABLET | Freq: Every day | ORAL | Status: AC

## 2013-06-17 NOTE — Telephone Encounter (Signed)
Call from [place], [place] reporting pt has temp of 100. Having some difficulty urinating. Dark, malodorous urine. Reports pt feels "shaky," blood sugar is normal. Requesting "prophylactic antiobiotic." Reviewed with Dr. Benay Spice: may be related to liver mets. OK to try antibiotic for 3 days. Order received for Cipro.

## 2013-06-28 ENCOUNTER — Telehealth: Payer: Self-pay | Admitting: *Deleted

## 2013-06-28 DIAGNOSIS — C155 Malignant neoplasm of lower third of esophagus: Secondary | ICD-10-CM

## 2013-06-28 MED ORDER — HYDROCORTISONE ACETATE 25 MG RE SUPP: 25.0000 mg | Freq: Every day | RECTAL | Status: AC | PRN

## 2013-06-28 MED ORDER — FENTANYL 12 MCG/HR TD PT72: 12.5000 ug | MEDICATED_PATCH | TRANSDERMAL | Status: AC

## 2013-06-28 MED ORDER — GUAIFENESIN 100 MG/5ML PO SOLN: 5.0000 mL | ORAL | Status: AC | PRN

## 2013-06-28 NOTE — Telephone Encounter (Signed)
Less able to take po's. Family requesting Duragesic patch. Taking oxyir 5 mg tid. Having rectal pain/discomfort from hemorrhoids- ? OK for Anusol supp? Also getting more confused and BP 88/40. Thick mucous-phlegm. Asking for prn Robitussin. MD approves all requests. Anusol & Robitussin e-scribed and Duragesic faxed.

## 2013-07-25 DEATH — deceased

## 2013-10-18 ENCOUNTER — Other Ambulatory Visit: Payer: Self-pay | Admitting: *Deleted

## 2013-10-24 IMAGING — CT NM PET TUM IMG INITIAL (PI) SKULL BASE T - THIGH
1 of 6 series · 1 of 25 positions shown · IV contrast ([ID])
Comparison: 10/29/2012

CLINICAL DATA: Initial treatment strategy for esophageal cancer..

EXAM:
NUCLEAR MEDICINE PET SKULL BASE TO THIGH
FASTING BLOOD GLUCOSE:  Value:  214 mg/dl
TECHNIQUE: One 2.3 mCi F-18 FDG was injected intravenously. CT data was
obtained and used for attenuation correction and anatomic
localization only. (This was not acquired as a diagnostic CT
examination.) Additional exam technical data entered on technologist
worksheet.

[Series 2: ct images · axial · 3.8mm · 0.98mm/px · 1 of 267 slices shown]
[im 267/267  brain]
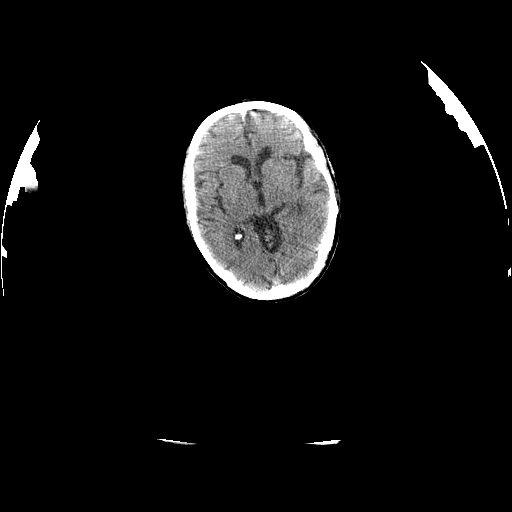

[1 of 25 positions shown; findings below may reference images not displayed]

FINDINGS: NECK

No hypermetabolic lymph nodes in the neck.

CHEST

Coronary atherosclerosis. Distal esophageal circumferential mass,
maximum standard uptake value of 13.1.

ABDOMEN/PELVIS

Gastrohepatic ligament node adjacent to the gastroesophageal
junction has short axis diameter 1.2 cm and maximum standard uptake
value of 9.8.

The segment 7 lesion in the right hepatic lobe has elevated
metabolic activity compared to the liver, with maximum SUV of 9.6,
compatible with metastatic disease.

No other metastatic lesions in the abdomen or pelvis identified.
Atherosclerosis noted. Sigmoid diverticulosis. Small left inguinal
hernia contains adipose tissue.

SKELETON

No focal hypermetabolic activity to suggest skeletal metastasis.
IMPRESSION: 1. Hypermetabolic distal esophageal mass, with adjacent
hypermetabolic adenopathy along the gastrohepatic ligament.
2. The mass in segment 7 of the liver shown on prior MRI is
hypermetabolic and most compatible with metastatic disease.
3. Atherosclerosis.
4. Sigmoid diverticulosis.
